# Patient Record
Sex: Male | Born: 1990 | Race: White | Hispanic: No | Marital: Married | Smoking: Never smoker
Health system: Southern US, Community
[De-identification: ages and names within clinical notes are randomized; demographics above are authoritative.]

## PROBLEM LIST (undated history)

## (undated) DIAGNOSIS — R51 Headache: Secondary | ICD-10-CM

## (undated) DIAGNOSIS — G8929 Other chronic pain: Secondary | ICD-10-CM

## (undated) DIAGNOSIS — R519 Headache, unspecified: Secondary | ICD-10-CM

## (undated) DIAGNOSIS — K226 Gastro-esophageal laceration-hemorrhage syndrome: Secondary | ICD-10-CM

## (undated) DIAGNOSIS — K920 Hematemesis: Secondary | ICD-10-CM

## (undated) DIAGNOSIS — M25569 Pain in unspecified knee: Secondary | ICD-10-CM

## (undated) HISTORY — PX: APPENDECTOMY: SHX54

## (undated) HISTORY — PX: OTHER SURGICAL HISTORY: SHX169

---
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
## 2010-03-28 ENCOUNTER — Emergency Department (HOSPITAL_COMMUNITY)
Admission: EM | Admit: 2010-03-28 | Discharge: 2010-03-28 | Payer: Self-pay | Source: Home / Self Care | Admitting: Emergency Medicine

## 2010-04-16 ENCOUNTER — Encounter: Payer: Self-pay | Admitting: Family Medicine
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
## 2010-04-26 NOTE — Letter (Signed)
Summary: History form  History form   Imported By: Jacklynn Ganong 07/04/2009 13:11:42  _____________________________________________________________________  External Attachment:    Type:   Image     Comment:   External Document

## 2010-07-09 LAB — RAPID STREP SCREEN (MED CTR MEBANE ONLY): Streptococcus, Group A Screen (Direct): NEGATIVE

## 2010-07-09 LAB — STREP A DNA PROBE

## 2013-02-09 ENCOUNTER — Encounter (HOSPITAL_COMMUNITY): Payer: Self-pay | Admitting: Emergency Medicine

## 2013-02-09 ENCOUNTER — Emergency Department (HOSPITAL_COMMUNITY)
Admission: EM | Admit: 2013-02-09 | Discharge: 2013-02-09 | Discharge status: Against Medical Advice | Payer: Self-pay | Attending: Emergency Medicine | Admitting: Emergency Medicine

## 2013-02-09 DIAGNOSIS — IMO0002 Reserved for concepts with insufficient information to code with codable children: Secondary | ICD-10-CM | POA: Insufficient documentation

## 2013-02-09 DIAGNOSIS — Y9389 Activity, other specified: Secondary | ICD-10-CM | POA: Insufficient documentation

## 2013-02-09 DIAGNOSIS — Y9289 Other specified places as the place of occurrence of the external cause: Secondary | ICD-10-CM | POA: Insufficient documentation

## 2013-02-09 DIAGNOSIS — S8990XA Unspecified injury of unspecified lower leg, initial encounter: Secondary | ICD-10-CM | POA: Insufficient documentation

## 2013-02-09 DIAGNOSIS — S79919A Unspecified injury of unspecified hip, initial encounter: Secondary | ICD-10-CM | POA: Insufficient documentation

## 2013-02-09 NOTE — ED Notes (Signed)
Pt was called but no answer for the third time.
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
## 2013-02-09 NOTE — ED Notes (Signed)
Pt called twice but no answer

## 2013-02-10 ENCOUNTER — Emergency Department (HOSPITAL_COMMUNITY): Payer: Self-pay

## 2013-02-10 ENCOUNTER — Emergency Department (HOSPITAL_COMMUNITY)
Admission: EM | Admit: 2013-02-10 | Discharge: 2013-02-10 | Disposition: A | Discharge status: Home / Self Care | Payer: Self-pay | Attending: Emergency Medicine | Admitting: Emergency Medicine

## 2013-02-10 ENCOUNTER — Encounter (HOSPITAL_COMMUNITY): Payer: Self-pay | Admitting: Emergency Medicine

## 2013-02-10 DIAGNOSIS — Y9389 Activity, other specified: Secondary | ICD-10-CM | POA: Insufficient documentation

## 2013-02-10 DIAGNOSIS — S7010XA Contusion of unspecified thigh, initial encounter: Secondary | ICD-10-CM | POA: Insufficient documentation

## 2013-02-10 DIAGNOSIS — S7002XA Contusion of left hip, initial encounter: Secondary | ICD-10-CM

## 2013-02-10 DIAGNOSIS — Y92410 Unspecified street and highway as the place of occurrence of the external cause: Secondary | ICD-10-CM | POA: Insufficient documentation

## 2013-02-10 MED ORDER — IBUPROFEN 600 MG PO TABS: 600.0000 mg | ORAL_TABLET | Freq: Four times a day (QID) | ORAL | Status: DC | PRN

## 2013-02-10 MED ORDER — IBUPROFEN 800 MG PO TABS
800.0000 mg | ORAL_TABLET | Freq: Once | ORAL | Status: AC
  Administered 2013-02-10: 800 mg via ORAL
  Filled 2013-02-10: qty 1
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
## 2013-02-12 NOTE — ED Provider Notes (Signed)
Medical screening examination/treatment/procedure(s) were performed by non-physician practitioner and as supervising physician I was immediately available for consultation/collaboration.  Flint Melter, MD 02/12/13 [phone]

## 2013-03-25 DIAGNOSIS — K226 Gastro-esophageal laceration-hemorrhage syndrome: Secondary | ICD-10-CM

## 2013-03-25 HISTORY — DX: Gastro-esophageal laceration-hemorrhage syndrome: K22.6

## 2013-03-26 ENCOUNTER — Encounter (HOSPITAL_COMMUNITY): Payer: Self-pay | Admitting: Emergency Medicine

## 2013-03-26 ENCOUNTER — Emergency Department (HOSPITAL_COMMUNITY)
Admission: EM | Admit: 2013-03-26 | Discharge: 2013-03-26 | Disposition: A | Discharge status: Home / Self Care | Payer: Self-pay | Attending: Emergency Medicine | Admitting: Emergency Medicine

## 2013-03-26 DIAGNOSIS — R197 Diarrhea, unspecified: Secondary | ICD-10-CM | POA: Insufficient documentation

## 2013-03-26 DIAGNOSIS — Z8739 Personal history of other diseases of the musculoskeletal system and connective tissue: Secondary | ICD-10-CM | POA: Insufficient documentation

## 2013-03-26 DIAGNOSIS — R112 Nausea with vomiting, unspecified: Secondary | ICD-10-CM | POA: Insufficient documentation

## 2013-03-26 DIAGNOSIS — K922 Gastrointestinal hemorrhage, unspecified: Secondary | ICD-10-CM | POA: Insufficient documentation

## 2013-03-26 LAB — CBC WITH DIFFERENTIAL/PLATELET
BASOS ABS: 0 10*3/uL (ref 0.0–0.1)
BASOS PCT: 0 % (ref 0–1)
EOS PCT: 1 % (ref 0–5)
Eosinophils Absolute: 0.1 10*3/uL (ref 0.0–0.7)
HCT: 40.6 % (ref 39.0–52.0)
Hemoglobin: 13.7 g/dL (ref 13.0–17.0)
LYMPHS PCT: 32 % (ref 12–46)
Lymphs Abs: 2.3 10*3/uL (ref 0.7–4.0)
MCH: 31.6 pg (ref 26.0–34.0)
MCHC: 33.7 g/dL (ref 30.0–36.0)
MCV: 93.5 fL (ref 78.0–100.0)
MONOS PCT: 7 % (ref 3–12)
Monocytes Absolute: 0.5 10*3/uL (ref 0.1–1.0)
NEUTROS ABS: 4.4 10*3/uL (ref 1.7–7.7)
NEUTROS PCT: 60 % (ref 43–77)
Platelets: 338 10*3/uL (ref 150–400)
RBC: 4.34 MIL/uL (ref 4.22–5.81)
RDW: 12.2 % (ref 11.5–15.5)
WBC: 7.2 10*3/uL (ref 4.0–10.5)

## 2013-03-26 LAB — COMPREHENSIVE METABOLIC PANEL
ALK PHOS: 43 U/L (ref 39–117)
ALT: 16 U/L (ref 0–53)
AST: 17 U/L (ref 0–37)
Albumin: 3.9 g/dL (ref 3.5–5.2)
BUN: 6 mg/dL (ref 6–23)
CHLORIDE: 104 meq/L (ref 96–112)
CO2: 26 mEq/L (ref 19–32)
CREATININE: 0.7 mg/dL (ref 0.50–1.35)
Calcium: 9.2 mg/dL (ref 8.4–10.5)
GFR calc non Af Amer: >90 mL/min (ref >90)
Glucose, Bld: 91 mg/dL (ref 70–99)
POTASSIUM: 3.5 meq/L — AB (ref 3.7–5.3)
SODIUM: 139 meq/L (ref 137–147)
Total Bilirubin: 0.3 mg/dL (ref 0.3–1.2)
Total Protein: 7.3 g/dL (ref 6.0–8.3)

## 2013-03-26 MED ORDER — CIPROFLOXACIN HCL 500 MG PO TABS: 500.0000 mg | ORAL_TABLET | Freq: Two times a day (BID) | ORAL | Status: DC

## 2013-03-26 MED ORDER — TRAMADOL HCL 50 MG PO TABS: 50.0000 mg | ORAL_TABLET | Freq: Four times a day (QID) | ORAL | Status: DC | PRN

## 2013-03-26 MED ORDER — OXYCODONE-ACETAMINOPHEN 5-325 MG PO TABS: 1.0000 | ORAL_TABLET | Freq: Four times a day (QID) | ORAL | Status: DC | PRN

## 2013-03-26 NOTE — ED Provider Notes (Signed)
CSN: [id number]     Arrival date & time 03/26/13  1251 History   First MD Initiated Contact with Patient 03/26/13 1424     Chief Complaint  Patient presents with  . Abdominal Pain   (Consider location/radiation/quality/duration/timing/severity/associated sxs/prior Treatment) Patient is a 23 y.o. male presenting with abdominal pain. The history is provided by the patient.  Abdominal Pain Associated symptoms: diarrhea and nausea   Associated symptoms: no chest pain, no shortness of breath and no vomiting    patient has had abdominal pain for the last week or 2. States it is in his lower abdomen. He also set some nausea with some diarrhea. Also blood coming out. The pain is constant. It is relieved by [facility] powder, but not ibuprofen. He's had a mildly decreased appetite. No fevers. No other bleeding. The pain is worse at night.  Past Medical History  Diagnosis Date  . Knee pain    History reviewed. No pertinent past surgical history. No family history on file. History  Substance Use Topics  . Smoking status: Never Smoker   . Smokeless tobacco: Not on file  . Alcohol Use: No     Comment: denies use tonight 02/09/13, denies use in 6 months    Review of Systems  Constitutional: Negative for activity change and appetite change.  Eyes: Negative for pain.  Respiratory: Negative for chest tightness and shortness of breath.   Cardiovascular: Negative for chest pain and leg swelling.  Gastrointestinal: Positive for nausea, abdominal pain, diarrhea, blood in stool and rectal pain. Negative for vomiting.  Genitourinary: Negative for flank pain.  Musculoskeletal: Negative for back pain and neck stiffness.  Skin: Negative for rash.  Neurological: Negative for weakness, numbness and headaches.  Psychiatric/Behavioral: Negative for behavioral problems.    Allergies  Review of patient's allergies indicates no known allergies.  Home Medications   Current Outpatient Rx  Name  Route  Sig   Dispense  Refill  . acetaminophen (TYLENOL) 500 MG tablet   Oral   Take 1,000 mg by mouth every 6 (six) hours as needed for moderate pain.         . Aspirin-Salicylamide-Caffeine (BC HEADACHE POWDER PO)   Oral   Take 1 packet by mouth daily as needed (pain).         . ciprofloxacin (CIPRO) 500 MG tablet   Oral   Take 1 tablet (500 mg total) by mouth 2 (two) times daily.   6 tablet   0   . traMADol (ULTRAM) 50 MG tablet   Oral   Take 1 tablet (50 mg total) by mouth every 6 (six) hours as needed.   15 tablet   0    BP 122/78  Pulse 85  Temp(Src) 98 F (36.7 C) (Oral)  Resp 18  Ht 5\' 2"  (1.575 m)  Wt 135 lb (61.236 kg)  BMI 24.69 kg/m2  SpO2 100% Physical Exam  Nursing note and vitals reviewed. Constitutional: He is oriented to person, place, and time. He appears well-developed and well-nourished.  HENT:  Head: Normocephalic and atraumatic.  Eyes: EOM are normal. Pupils are equal, round, and reactive to light.  Neck: Normal range of motion. Neck supple.  Cardiovascular: Normal rate, regular rhythm and normal heart sounds.   No murmur heard. Pulmonary/Chest: Effort normal and breath sounds normal.  Abdominal: Soft. Bowel sounds are normal. He exhibits no distension and no mass. There is no tenderness. There is no rebound and no guarding.  Musculoskeletal: Normal range of motion. He  exhibits no edema.  Neurological: He is alert and oriented to person, place, and time. No cranial nerve deficit.  Skin: Skin is warm and dry.  Psychiatric: He has a normal mood and affect.    ED Course  Procedures (including critical care time) Labs Review Labs Reviewed  COMPREHENSIVE METABOLIC PANEL - Abnormal; Notable for the following:    Potassium 3.5 (*)    All other components within normal limits  CBC WITH DIFFERENTIAL   Imaging Review No results found.  EKG Interpretation   None       MDM   1. Nausea vomiting and diarrhea   2. GI bleed    Patient with nausea  vomiting diarrhea. Has had some blood in the stool also. Also rectal pain or abdominal pain. Lab work reassuring. Patient does not have insurance and hopes to minimize expenses. Benign exam. We'll treat with antibiotics and have her followup with gastroenterology.   Juliet RudeNathan R. Rubin PayorPickering, MD 03/26/13 [phone]

## 2013-03-26 NOTE — ED Notes (Signed)
Pt c/o abd pain and has noticed bright red blood in his stool intermittently over the past 2 weeks.  Also reports intermittent n/v.

## 2013-03-26 NOTE — ED Notes (Signed)
Cancel departure condition and care handoff.  Wrong pt

## 2013-03-26 NOTE — Discharge Instructions (Signed)
Bloody Diarrhea  Bloody diarrhea can be caused by many different conditions. Most of the time bloody diarrhea is the result of food poisoning or minor infections. Bloody diarrhea usually improves over 2 to 3 days of rest and fluid replacement. Other conditions that can cause bloody diarrhea include:   Internal bleeding.   Infection.   Diseases of the bowel and colon.  Internal bleeding from an ulcer or bowel disease can be severe and requires hospital care or even surgery.  DIAGNOSIS   To find out what is wrong your caregiver may check your:   Stool.   Blood.   Results from a test that looks inside the body (endoscopy).  TREATMENT    Get plenty of rest.   Drink enough water and fluids to keep your urine clear or pale yellow.   Do not smoke.   Solid foods and dairy products should be avoided until your illness improves.   As you improve, slowly return to a regular diet with easily-digested foods first. Examples are:   Bananas.   Rice.   Toast.   Crackers.  You should only need these for about 2 days before adding more normal foods to your diet.   Avoid spicy or fatty foods as well as caffeine and alcohol for several days.   Medicine to control cramping and diarrhea can relieve symptoms but may prolong some cases of bloody diarrhea. Antibiotics can speed recovery from diarrhea due to some bacterial infections. Call your caregiver if diarrhea does not get better in 3 days.  SEEK MEDICAL CARE IF:    You do not improve after 3 days.   Your diarrhea improves but your stool appears black.  SEEK IMMEDIATE MEDICAL CARE IF:    You become extremely weak or faint.   You become very sweaty.   You have increased pain or bleeding.   You develop repeated vomiting.   You vomit and you see blood or the vomit looks black in color.   You have a fever.  Document Released: 03/11/2005 Document Revised: 06/03/2011 Document Reviewed: 02/10/2009  [employer] Patient Information 2014 [employer].

## 2013-06-25 ENCOUNTER — Observation Stay (HOSPITAL_COMMUNITY)
Admission: EM | Admit: 2013-06-25 | Discharge: 2013-06-26 | Disposition: A | Discharge status: Home / Self Care | Payer: Self-pay | Attending: Family Medicine | Admitting: Family Medicine

## 2013-06-25 ENCOUNTER — Encounter (HOSPITAL_COMMUNITY)
Admission: EM | Disposition: A | Discharge status: Home / Self Care | Payer: Self-pay | Source: Home / Self Care | Attending: Emergency Medicine

## 2013-06-25 ENCOUNTER — Encounter (HOSPITAL_COMMUNITY): Payer: Self-pay | Admitting: Emergency Medicine

## 2013-06-25 DIAGNOSIS — T39395A Adverse effect of other nonsteroidal anti-inflammatory drugs [NSAID], initial encounter: Secondary | ICD-10-CM

## 2013-06-25 DIAGNOSIS — G8929 Other chronic pain: Secondary | ICD-10-CM | POA: Insufficient documentation

## 2013-06-25 DIAGNOSIS — T398X5A Adverse effect of other nonopioid analgesics and antipyretics, not elsewhere classified, initial encounter: Secondary | ICD-10-CM

## 2013-06-25 DIAGNOSIS — K922 Gastrointestinal hemorrhage, unspecified: Secondary | ICD-10-CM | POA: Diagnosis present

## 2013-06-25 DIAGNOSIS — T3995XA Adverse effect of unspecified nonopioid analgesic, antipyretic and antirheumatic, initial encounter: Secondary | ICD-10-CM | POA: Insufficient documentation

## 2013-06-25 DIAGNOSIS — R51 Headache: Secondary | ICD-10-CM | POA: Insufficient documentation

## 2013-06-25 DIAGNOSIS — M25569 Pain in unspecified knee: Secondary | ICD-10-CM | POA: Insufficient documentation

## 2013-06-25 DIAGNOSIS — K226 Gastro-esophageal laceration-hemorrhage syndrome: Principal | ICD-10-CM | POA: Insufficient documentation

## 2013-06-25 HISTORY — DX: Hematemesis: K92.0

## 2013-06-25 HISTORY — PX: ESOPHAGOGASTRODUODENOSCOPY: SHX5428

## 2013-06-25 LAB — POC OCCULT BLOOD, ED: Fecal Occult Bld: POSITIVE — AB

## 2013-06-25 LAB — COMPREHENSIVE METABOLIC PANEL
ALBUMIN: 4.2 g/dL (ref 3.5–5.2)
ALT: 23 U/L (ref 0–53)
AST: 21 U/L (ref 0–37)
Alkaline Phosphatase: 43 U/L (ref 39–117)
BUN: 22 mg/dL (ref 6–23)
CHLORIDE: 102 meq/L (ref 96–112)
CO2: 26 meq/L (ref 19–32)
Calcium: 9 mg/dL (ref 8.4–10.5)
Creatinine, Ser: 0.69 mg/dL (ref 0.50–1.35)
GLUCOSE: 107 mg/dL — AB (ref 70–99)
Potassium: 4.1 mEq/L (ref 3.7–5.3)
SODIUM: 140 meq/L (ref 137–147)
TOTAL PROTEIN: 7.5 g/dL (ref 6.0–8.3)
Total Bilirubin: 1 mg/dL (ref 0.3–1.2)

## 2013-06-25 LAB — CBC WITH DIFFERENTIAL/PLATELET
BASOS ABS: 0 10*3/uL (ref 0.0–0.1)
Basophils Relative: 0 % (ref 0–1)
Eosinophils Absolute: 0.1 10*3/uL (ref 0.0–0.7)
Eosinophils Relative: 1 % (ref 0–5)
HCT: 39.4 % (ref 39.0–52.0)
Hemoglobin: 13 g/dL (ref 13.0–17.0)
LYMPHS ABS: 1.9 10*3/uL (ref 0.7–4.0)
Lymphocytes Relative: 17 % (ref 12–46)
MCH: 31.3 pg (ref 26.0–34.0)
MCHC: 33 g/dL (ref 30.0–36.0)
MCV: 94.9 fL (ref 78.0–100.0)
MONOS PCT: 6 % (ref 3–12)
Monocytes Absolute: 0.7 10*3/uL (ref 0.1–1.0)
NEUTROS PCT: 76 % (ref 43–77)
Neutro Abs: 8.5 10*3/uL — ABNORMAL HIGH (ref 1.7–7.7)
PLATELETS: 332 10*3/uL (ref 150–400)
RBC: 4.15 MIL/uL — ABNORMAL LOW (ref 4.22–5.81)
RDW: 12.9 % (ref 11.5–15.5)
WBC: 11.2 10*3/uL — AB (ref 4.0–10.5)

## 2013-06-25 LAB — SAMPLE TO BLOOD BANK

## 2013-06-25 SURGERY — EGD (ESOPHAGOGASTRODUODENOSCOPY)
Anesthesia: Moderate Sedation

## 2013-06-25 MED ORDER — ONDANSETRON HCL 4 MG/2ML IJ SOLN
4.0000 mg | Freq: Once | INTRAMUSCULAR | Status: AC
  Administered 2013-06-25: 4 mg via INTRAMUSCULAR
  Filled 2013-06-25: qty 2

## 2013-06-25 MED ORDER — LIDOCAINE VISCOUS 2 % MT SOLN
OROMUCOSAL | Status: DC | PRN
  Administered 2013-06-25: 1 via OROMUCOSAL

## 2013-06-25 MED ORDER — LIDOCAINE VISCOUS 2 % MT SOLN
OROMUCOSAL | Status: AC
  Filled 2013-06-25: qty 15

## 2013-06-25 MED ORDER — SODIUM CHLORIDE 0.9 % IJ SOLN
PREFILLED_SYRINGE | INTRAMUSCULAR | Status: DC | PRN
  Administered 2013-06-25 (×4)

## 2013-06-25 MED ORDER — METOCLOPRAMIDE HCL 5 MG/ML IJ SOLN
20.0000 mg | Freq: Once | INTRAVENOUS | Status: AC
  Administered 2013-06-25: 20 mg via INTRAVENOUS
  Filled 2013-06-25: qty 4

## 2013-06-25 MED ORDER — MEPERIDINE HCL 100 MG/ML IJ SOLN
INTRAMUSCULAR | Status: AC
  Filled 2013-06-25: qty 2

## 2013-06-25 MED ORDER — EPINEPHRINE HCL 0.1 MG/ML IJ SOSY
PREFILLED_SYRINGE | INTRAMUSCULAR | Status: AC
  Filled 2013-06-25: qty 10

## 2013-06-25 MED ORDER — ONDANSETRON HCL 4 MG/2ML IJ SOLN: 4.0000 mg | Freq: Four times a day (QID) | INTRAMUSCULAR | Status: DC | PRN

## 2013-06-25 MED ORDER — SODIUM CHLORIDE 0.9 % IJ SOLN
3.0000 mL | Freq: Two times a day (BID) | INTRAMUSCULAR | Status: DC
  Administered 2013-06-25 – 2013-06-26 (×2): 3 mL via INTRAVENOUS

## 2013-06-25 MED ORDER — ONDANSETRON HCL 4 MG PO TABS: 4.0000 mg | ORAL_TABLET | Freq: Four times a day (QID) | ORAL | Status: DC | PRN

## 2013-06-25 MED ORDER — FAMOTIDINE IN NACL 20-0.9 MG/50ML-% IV SOLN
20.0000 mg | Freq: Once | INTRAVENOUS | Status: AC
  Administered 2013-06-25: 20 mg via INTRAVENOUS
  Filled 2013-06-25: qty 50

## 2013-06-25 MED ORDER — STERILE WATER FOR IRRIGATION IR SOLN
Status: DC | PRN
  Administered 2013-06-25: 11:00:00

## 2013-06-25 MED ORDER — SODIUM CHLORIDE 0.9 % IV SOLN: 250.0000 mL | INTRAVENOUS | Status: DC | PRN

## 2013-06-25 MED ORDER — ACETAMINOPHEN 325 MG PO TABS
650.0000 mg | ORAL_TABLET | Freq: Four times a day (QID) | ORAL | Status: DC | PRN
  Administered 2013-06-25 – 2013-06-26 (×2): 650 mg via ORAL
  Filled 2013-06-25 (×3): qty 2

## 2013-06-25 MED ORDER — MEPERIDINE HCL 100 MG/ML IJ SOLN
INTRAMUSCULAR | Status: DC | PRN
  Administered 2013-06-25 (×5): 25 mg via INTRAVENOUS

## 2013-06-25 MED ORDER — MIDAZOLAM HCL 5 MG/5ML IJ SOLN
INTRAMUSCULAR | Status: DC | PRN
  Administered 2013-06-25: 2 mg via INTRAVENOUS
  Administered 2013-06-25: 1 mg via INTRAVENOUS
  Administered 2013-06-25 (×2): 2 mg via INTRAVENOUS

## 2013-06-25 MED ORDER — SODIUM CHLORIDE 0.9 % IV SOLN: INTRAVENOUS | Status: DC

## 2013-06-25 MED ORDER — MORPHINE SULFATE 4 MG/ML IJ SOLN
4.0000 mg | Freq: Once | INTRAMUSCULAR | Status: AC
  Administered 2013-06-25: 4 mg via INTRAVENOUS
  Filled 2013-06-25: qty 1

## 2013-06-25 MED ORDER — ALUM & MAG HYDROXIDE-SIMETH 200-200-20 MG/5ML PO SUSP: 30.0000 mL | Freq: Four times a day (QID) | ORAL | Status: DC | PRN

## 2013-06-25 MED ORDER — SODIUM CHLORIDE 0.9 % IV BOLUS (SEPSIS)
1000.0000 mL | Freq: Once | INTRAVENOUS | Status: AC
  Administered 2013-06-25: 1000 mL via INTRAVENOUS

## 2013-06-25 MED ORDER — MIDAZOLAM HCL 5 MG/5ML IJ SOLN
INTRAMUSCULAR | Status: AC
  Filled 2013-06-25: qty 10

## 2013-06-25 MED ORDER — PROMETHAZINE HCL 25 MG/ML IJ SOLN
12.5000 mg | INTRAMUSCULAR | Status: AC
  Administered 2013-06-25: 12.5 mg via INTRAVENOUS
  Filled 2013-06-25: qty 1

## 2013-06-25 MED ORDER — PANTOPRAZOLE SODIUM 40 MG PO TBEC
40.0000 mg | DELAYED_RELEASE_TABLET | Freq: Every day | ORAL | Status: DC
Start: 2013-06-26 — End: 2013-06-26
  Administered 2013-06-26: 40 mg via ORAL
  Filled 2013-06-25: qty 1

## 2013-06-25 MED ORDER — PROMETHAZINE HCL 25 MG/ML IJ SOLN
INTRAMUSCULAR | Status: DC | PRN
  Administered 2013-06-25: 12.5 mg via INTRAVENOUS

## 2013-06-25 MED ORDER — PROMETHAZINE HCL 25 MG/ML IJ SOLN
INTRAMUSCULAR | Status: AC
  Filled 2013-06-25: qty 1

## 2013-06-25 MED ORDER — PANTOPRAZOLE SODIUM 40 MG PO TBEC
40.0000 mg | DELAYED_RELEASE_TABLET | Freq: Once | ORAL | Status: AC
  Administered 2013-06-25: 40 mg via ORAL
  Filled 2013-06-25: qty 1

## 2013-06-25 MED ORDER — SODIUM CHLORIDE 0.9 % IJ SOLN: 3.0000 mL | INTRAMUSCULAR | Status: DC | PRN

## 2013-06-25 NOTE — ED Notes (Signed)
Pt went to bathroom, had vomiting episode. In to check and toilet full of bright red blood. PA aware

## 2013-06-25 NOTE — Care Management Note (Signed)
ED/CM noted patient did not have health insurance and/or PCP listed in the computer.  [facility] was given the [facility] handout with information on the clinics, food pantries, and the handout for new health insurance sign-up.  Patient expressed appreciation for information received. Pt was also given the financial counselor, Kathie RhodesBetty Ratliff's name and number for assistance with the hospital bill, and a discount drug card

## 2013-06-25 NOTE — Op Note (Signed)
[facility] [address] [place], [id number]   ENDOSCOPY PROCEDURE REPORT  PATIENT: James Boyle, James R.  MR#: [id number] BIRTHDATE: Aug 29, 1990 , 23  yrs. old GENDER: Male  ENDOSCOPIST: Jonette EvaSandi Lindie Roberson, MD REFERRED BY:  PROCEDURE DATE: 06/25/2013 PROCEDURE:   EGD w/ control of bleeding and EGD w/ directed submucosal injection: epi 8 cc  INDICATIONS:Hematemesis. MEDICATIONS: Promethazine (Phenergan) 25mg  IV, Demerol 100 mg IV, and Versed 6 mg IV TOPICAL ANESTHETIC:   Viscous Xylocaine  DESCRIPTION OF PROCEDURE:     Physical exam was performed.  Informed consent was obtained from the patient after explaining the benefits, risks, and alternatives to the procedure.  The patient was connected to the monitor and placed in the left lateral position.  Continuous oxygen was provided by nasal cannula and IV medicine administered through an indwelling cannula.  After administration of sedation, the patients esophagus was intubated and the     endoscope was advanced under direct visualization to the second portion of the duodenum.  The scope was removed slowly by carefully examining the color, texture, anatomy, and integrity of the mucosa on the way out.  The patient was recovered in endoscopy and discharged home in satisfactory condition.    BRIGHT RED BLOOD IN DISTAL ESOPHAGUS.  TWO TEARS NOTED AT THE GE JUNCTION WERE ACTIVELY BLEEDING. 8cc epinephrine injected in the submucosal space.   MODERATE AMOUNT OF CLOT/BRIGHT RED BLOOD IN FUNDUS.  SMALL AMOUNT THROUGHOUT ENTIRE STOMACH AND SMALL BOWEL. SLIGHTLY LIMITED ENDOSCOPY DUE TO RETAINED BLOOD IN STOMACH AND SMALL BOWEL.  NO ULCERS APPRECIATED IN STOMACH OR SMALL BOWEL. 4(2: BS, 2: COOK) OUT 5 CLIPS PLACED Successfully AT THE GE JUNCTION.    Hemostasis achieved. 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 AVOID ASA/NSAIDS FOR 2 WEEKS THEN MAY TAKE WITH  A DAILY PPI. FULL LIQUID DIET.  WILL ADVANCE IN AM APR 4. ANTICIPATE D/C APR 4 IF HB STABLE AND PT DOES NOT HAVE HEMATEMESIS.  DISCUSSED WITH DR.  Irene LimboGOODRICH AND PT'S GRANDPARENTS.  REPEAT EXAM: ______________________________ Rosalie DoctoreSignedJonette Eva:  James Febo, MD 06/25/2013 2:31 PM

## 2013-06-25 NOTE — H&P (View-Only) (Signed)
Referring Provider: No ref. provider found Primary Care Physician:  No PCP Per Patient Primary Gastroenterologist:  Jonette EvaSandi Rayson Rando  Reason for Consultation:  VOMITING UP BLOOD   HPI:  PT STARTED VOMITING BLOOD THIS AM AT 5 AM. LAST TME 30 MINS AGO. USING BC POWDERS FOR JOINT PAIN. PT DENIES FEVER, CHILLS, BRBPR, nausea, melena, abd pain, problems swallowing, OR heartburn or indigestion.  Past Medical History  Diagnosis Date  . Knee pain   . Hematemesis     History reviewed. No pertinent past surgical history.  Prior to Admission medications   Medication Sig Start Date End Date Taking? Authorizing Provider  Aspirin-Salicylamide-Caffeine (BC HEADACHE POWDER PO) Take 1 packet by mouth 3 (three) times daily. Patient takes 2 packets every morning when he gets up and takes 1 at work around lunchtime and then takes another one when he gets home if he feels like he needs it.   Yes Historical Provider, MD    Current Facility-Administered Medications  Medication Dose Route Frequency Provider Last Rate Last Dose  . metoCLOPramide (REGLAN) 20 mg in dextrose 5 % 50 mL IVPB  20 mg Intravenous Once Burgess AmorJulie Idol, PA-C       Current Outpatient Prescriptions  Medication Sig Dispense Refill  . Aspirin-Salicylamide-Caffeine (BC HEADACHE POWDER PO) Take 1 packet by mouth 3 (three) times daily. Patient takes 2 packets every morning when he gets up and takes 1 at work around lunchtime and then takes another one when he gets home if he feels like he needs it.        Allergies as of 06/25/2013  . (No Known Allergies)      History   Social History  . Marital Status: Married    Spouse Name: N/A    Number of Children: ONE  . Years of Education: N/A   Occupational History  . WORKS IN CONSTRUCTION   Social History Main Topics  . Smoking status: Never Smoker   . Smokeless tobacco: Not on file  . Alcohol Use: No     Comment: denies use tonight 02/09/13, denies use in 6 months  . Drug Use: No  .  Sexual Activity: Not on file     Review of Systems: PER HPI OTHERWISE ALL SYSTEMS ARE NEGATIVE.   Vitals: Blood pressure 122/75, pulse 110, temperature 97.6 F (36.4 C), temperature source Oral, resp. rate 12, height 5\' 2"  (1.575 m), weight 145 lb (65.772 kg), SpO2 98.00%.  Physical Exam: General:   Alert,  Well-developed, well-nourished, pleasant and cooperative in NAD Head:  Normocephalic and atraumatic. Eyes:  Sclera clear, no icterus.   Conjunctiva pink. Mouth:  No deformity or lesions Neck:  Supple; no masses  Lungs:  Clear throughout to auscultation.   No wheezes. No acute distress. Heart:  Regular rate and rhythm; no murmurs. Abdomen:  Soft, nontender and nondistended. No masses noted. Normal bowel sounds, without guarding, and without rebound.   Msk:  Symmetrical without gross deformities. Normal posture. Extremities:  Without edema. Neurologic:  Alert and  oriented x4;  grossly normal neurologically. Cervical Nodes:  No significant cervical adenopathy. Psych:  Alert and cooperative. Normal mood and affect.   Lab Results:  Recent Labs  06/25/13 0846  WBC 11.2*  HGB 13.0  HCT 39.4  PLT 332   BMET  Recent Labs  06/25/13 0846  NA 140  K 4.1  CL 102  CO2 26  GLUCOSE 107*  BUN 22  CREATININE 0.69  CALCIUM 9.0   LFT  Recent Labs  06/25/13 0846  PROT 7.5  ALBUMIN 4.2  AST 21  ALT 23  ALKPHOS 43  BILITOT 1.0     Studies/Results: NONE  Impression: ADMITTED TO ED WITH HEMATEMESIS MOST LIKELY DUE TO PUD, LESS LIKELY MALLORY WEISS TEAR, OR GASTRIC TUMOR. S/P REGLAN AND PO PROTONIX  Plan: 1. EGD ASAP 2. WILL NEED PROTONIX INFUSION IF ULCERS NEED INTERVENTION. 3. PT SHOULD AVOID AS/NSAIDS FOR 3 MOS   LOS: 0 days   Lile Mccurley  06/25/2013, 9:32 AM

## 2013-06-25 NOTE — H&P (Signed)
History and Physical  James LovingsJames R Burgoon ZOX:[id number] DOB: 03/04/1991 DOA: 06/25/2013  Referring physician: Burgess AmorJulie Idol, PA-C PCP: No PCP Per Patient   Chief Complaint: Bleeding with vomiting  HPI:  23 year old man with history of daily BC powder use for chronic headaches and chronic knee pain woke up this morning with hematemesis. Initial evaluation was unrevealing, patient was seen by gastroenterology and referred for admission.  Patient reports chronic headaches and chronic knee pain for which he takes BC powders. Today when he woke up he vomited bright red blood several times and had mild abdominal pain. Felt fine yesterday with no bleeding yesterday.  In the emergency department and be afebrile with stable vital signs. Complete metabolic panel is unremarkable. CBC grossly unremarkable. Fecal occult blood positive. Underwent bedside EGD in the emergency department which revealed Mallory-Weiss tear with active bleeding, treated with clips and epinephrine. I was asked to see the patient for admission after EGD was performed.  Currently the patient feels well except for chronic headache he is hungry.  Review of Systems:  Negative for fever, visual changes, sore throat, rash, new muscle aches, chest pain, SOB, dysuria.  Past Medical History  Diagnosis Date  . Knee pain   . Hematemesis     Past Surgical History  Procedure Laterality Date  . None      Social History:  reports that he has never smoked. He does not have any smokeless tobacco history on file. He reports that he does not drink alcohol or use illicit drugs.  No Known Allergies  History reviewed. No pertinent family history. No particular medical problems in family except mother had back surgery   Prior to Admission medications   Medication Sig Start Date End Date Taking? Authorizing Provider  Aspirin-Salicylamide-Caffeine (BC HEADACHE POWDER PO) Take 1 packet by mouth 3 (three) times daily. Patient takes 2 packets every  morning when he gets up and takes 1 at work around lunchtime and then takes another one when he gets home if he feels like he needs it.   Yes Historical Provider, MD   Physical Exam: Filed Vitals:   06/25/13 1205 06/25/13 1210 06/25/13 1302 06/25/13 1415  BP: 137/77   112/74  Pulse: 120 109  161  Temp:    97.4 F (36.3 C)  TempSrc:    Oral  Resp: 23 24 20 22   Height:      Weight:      SpO2: 92% 96% 97% 97%   General:  Appears calm and comfortable Eyes: Grossly normal pupils, irises, lids ENT: grossly normal hearing, lips & tongue Neck: no LAD, masses or thyromegaly Cardiovascular: RRR, no m/r/g. No LE edema. Respiratory: CTA bilaterally, no w/r/r. Normal respiratory effort. Abdomen: soft, ntnd Skin: no rash or induration seen. Multiple tattoos noted. Musculoskeletal: grossly normal tone BUE/BLE Psychiatric: grossly normal mood and affect, speech fluent and appropriate Neurologic: grossly non-focal.  Wt Readings from Last 3 Encounters:  06/25/13 65.772 kg (145 lb)  06/25/13 65.772 kg (145 lb)  03/26/13 61.236 kg (135 lb)    Labs on Admission:  Basic Metabolic Panel:  Recent Labs Lab 06/25/13 0846  NA 140  K 4.1  CL 102  CO2 26  GLUCOSE 107*  BUN 22  CREATININE 0.69  CALCIUM 9.0    Liver Function Tests:  Recent Labs Lab 06/25/13 0846  AST 21  ALT 23  ALKPHOS 43  BILITOT 1.0  PROT 7.5  ALBUMIN 4.2    CBC:  Recent Labs Lab 06/25/13 0846  WBC 11.2*  NEUTROABS 8.5*  HGB 13.0  HCT 39.4  MCV 94.9  PLT 332    Active Problems:   GI bleed due to NSAIDs   Assessment/Plan 1. Upper GI bleed secondary to Mallory-Weiss tear. 2. Chronic NSAID use for chronic headaches and chronic knee pain.   Observation overnight. Daily PPI. Avoid aspirin, NSAIDs for 2 weeks.  Likely home 4/4 if hemoglobin stable and no further bleeding.  Discussed with Dr. Darrick Penna  Code Status: full code  DVT prophylaxis:SCDs Family Communication: none present Disposition  Plan/Anticipated LOS: obs, 1-2 days  Time spent: 45 minutes  Brendia Sacks, MD  [facility] Pager [phone] 06/25/2013, 4:40 PM

## 2013-06-25 NOTE — ED Notes (Signed)
Dr Darrick PennaFields in with pt.

## 2013-06-25 NOTE — ED Notes (Signed)
Pt started with emesis at 5 am, reports 10 episodes of emesis, pt states the blood is dark red. Pt states he has HX of same.

## 2013-06-25 NOTE — ED Provider Notes (Signed)
Medical screening examination/treatment/procedure(s) were performed by non-physician practitioner and as supervising physician I was immediately available for consultation/collaboration.   EKG Interpretation None        Rhylei Mcquaig Oseias, MD 06/25/13 1601 

## 2013-06-25 NOTE — Interval H&P Note (Signed)
History and Physical Interval Note:  06/25/2013 11:23 AM  James Boyle  has presented today for surgery, with the diagnosis of hematesis  The various methods of treatment have been discussed with the patient and family. After consideration of risks, benefits and other options for treatment, the patient has consented to  Procedure(s): ESOPHAGOGASTRODUODENOSCOPY (EGD) (N/A) as a surgical intervention .  The patient's history has been reviewed, patient examined, no change in status, stable for surgery.  I have reviewed the patient's chart and labs.  Questions were answered to the patient's satisfaction.     Eaton CorporationSandi Carlosdaniel Grob

## 2013-06-25 NOTE — ED Notes (Signed)
pt c/o headache.

## 2013-06-25 NOTE — Consult Note (Signed)
Referring Provider: No ref. provider found Primary Care Physician:  No PCP Per Patient Primary Gastroenterologist:  [name]  Reason for Consultation:  VOMITING UP BLOOD   HPI:  PT STARTED VOMITING BLOOD THIS AM AT 5 AM. LAST TME 30 MINS AGO. USING BC POWDERS FOR JOINT PAIN. PT DENIES FEVER, CHILLS, BRBPR, nausea, melena, abd pain, problems swallowing, OR heartburn or indigestion.  Past Medical History  Diagnosis Date  . Knee pain   . Hematemesis     History reviewed. No pertinent past surgical history.  Prior to Admission medications   Medication Sig Start Date End Date Taking? Authorizing Provider  Aspirin-Salicylamide-Caffeine (BC HEADACHE POWDER PO) Take 1 packet by mouth 3 (three) times daily. Patient takes 2 packets every morning when he gets up and takes 1 at work around lunchtime and then takes another one when he gets home if he feels like he needs it.   Yes Historical Provider, MD    Current Facility-Administered Medications  Medication Dose Route Frequency Provider Last Rate Last Dose  . metoCLOPramide (REGLAN) 20 mg in dextrose 5 % 50 mL IVPB  20 mg Intravenous Once Burgess AmorJulie Idol, PA-C       Current Outpatient Prescriptions  Medication Sig Dispense Refill  . Aspirin-Salicylamide-Caffeine (BC HEADACHE POWDER PO) Take 1 packet by mouth 3 (three) times daily. Patient takes 2 packets every morning when he gets up and takes 1 at work around lunchtime and then takes another one when he gets home if he feels like he needs it.        Allergies as of 06/25/2013  . (No Known Allergies)      History   Social History  . Marital Status: Married    Spouse Name: N/A    Number of Children: ONE  . Years of Education: N/A   Occupational History  . WORKS IN CONSTRUCTION   Social History Main Topics  . Smoking status: Never Smoker   . Smokeless tobacco: Not on file  . Alcohol Use: No     Comment: denies use tonight 02/09/13, denies use in 6 months  . Drug Use: No  .  Sexual Activity: Not on file     Review of Systems: PER HPI OTHERWISE ALL SYSTEMS ARE NEGATIVE.   Vitals: Blood pressure 122/75, pulse 110, temperature 97.6 F (36.4 C), temperature source Oral, resp. rate 12, height 5\' 2"  (1.575 m), weight 145 lb (65.772 kg), SpO2 98.00%.  Physical Exam: General:   Alert,  Well-developed, well-nourished, pleasant and cooperative in NAD Head:  Normocephalic and atraumatic. Eyes:  Sclera clear, no icterus.   Conjunctiva pink. Mouth:  No deformity or lesions Neck:  Supple; no masses  Lungs:  Clear throughout to auscultation.   No wheezes. No acute distress. Heart:  Regular rate and rhythm; no murmurs. Abdomen:  Soft, nontender and nondistended. No masses noted. Normal bowel sounds, without guarding, and without rebound.   Msk:  Symmetrical without gross deformities. Normal posture. Extremities:  Without edema. Neurologic:  Alert and  oriented x4;  grossly normal neurologically. Cervical Nodes:  No significant cervical adenopathy. Psych:  Alert and cooperative. Normal mood and affect.   Lab Results:  Recent Labs  06/25/13 0846  WBC 11.2*  HGB 13.0  HCT 39.4  PLT 332   BMET  Recent Labs  06/25/13 0846  NA 140  K 4.1  CL 102  CO2 26  GLUCOSE 107*  BUN 22  CREATININE 0.69  CALCIUM 9.0   LFT  Recent Labs  06/25/13 0846  PROT 7.5  ALBUMIN 4.2  AST 21  ALT 23  ALKPHOS 43  BILITOT 1.0     Studies/Results: NONE  Impression: ADMITTED TO ED WITH HEMATEMESIS MOST LIKELY DUE TO PUD, LESS LIKELY MALLORY WEISS TEAR, OR GASTRIC TUMOR. S/P REGLAN AND PO PROTONIX  Plan: 1. EGD ASAP 2. WILL NEED PROTONIX INFUSION IF ULCERS NEED INTERVENTION. 3. PT SHOULD AVOID AS/NSAIDS FOR 3 MOS   LOS: 0 days   James Boyle  06/25/2013, 9:32 AM

## 2013-06-25 NOTE — ED Provider Notes (Signed)
CSN: [id number]     Arrival date & time 06/25/13  [id number] History   First MD Initiated Contact with Patient 06/25/13 0805     Chief Complaint  Patient presents with  . Hematemesis     (Consider location/radiation/quality/duration/timing/severity/associated sxs/prior Treatment) HPI Comments: James Boyle is a 23 y.o. Male presenting with complaint of upper abdominal pain and hematemesis along with one black but formed stool since 5 am this am.  He describes about 10 episodes of vomiting,  With both bright red and darker appearing blood, starting with the first emesis. He does state he has frequent upper abdominal and midsternal burning with meals but denies any treatment for this such as antacids or PPIs.   He endorses about 4 doses of BC powders daily for both headache and chronic knee pain.  He denies etoh use and substance abuse, stating he used to use marijuana and narcotic pills, but not since being on probation.  He works as a Corporate investment bankerconstruction worker.     The history is provided by the patient.    Past Medical History  Diagnosis Date  . Knee pain   . Hematemesis    History reviewed. No pertinent past surgical history. History reviewed. No pertinent family history. History  Substance Use Topics  . Smoking status: Never Smoker   . Smokeless tobacco: Not on file  . Alcohol Use: No     Comment: denies use tonight 02/09/13, denies use in 6 months    Review of Systems  Constitutional: Negative for fever and chills.  HENT: Negative for congestion and sore throat.   Eyes: Negative.   Respiratory: Negative for chest tightness and shortness of breath.   Cardiovascular: Negative for chest pain.  Gastrointestinal: Positive for nausea, vomiting, abdominal pain and blood in stool. Negative for diarrhea, abdominal distention and rectal pain.  Genitourinary: Negative.   Musculoskeletal: Negative for arthralgias, joint swelling and neck pain.  Skin: Negative.  Negative for rash and wound.   Neurological: Negative for dizziness, weakness, light-headedness, numbness and headaches.  Psychiatric/Behavioral: Negative.       Allergies  Review of patient's allergies indicates no known allergies.  Home Medications   Current Outpatient Rx  Name  Route  Sig  Dispense  Refill  . Aspirin-Salicylamide-Caffeine (BC HEADACHE POWDER PO)   Oral   Take 1 packet by mouth 3 (three) times daily. Patient takes 2 packets every morning when he gets up and takes 1 at work around lunchtime and then takes another one when he gets home if he feels like he needs it.          BP 122/75  Pulse 110  Temp(Src) 97.6 F (36.4 C) (Oral)  Resp 12  Ht 5\' 2"  (1.575 m)  Wt 145 lb (65.772 kg)  BMI 26.51 kg/m2  SpO2 98% Physical Exam  Nursing note and vitals reviewed. Constitutional: He appears well-developed and well-nourished.  HENT:  Head: Normocephalic and atraumatic.  Eyes: Conjunctivae are normal.  Neck: Normal range of motion.  Cardiovascular: Normal rate, regular rhythm, normal heart sounds and intact distal pulses.   Pulmonary/Chest: Effort normal and breath sounds normal. He has no wheezes.  Abdominal: Soft. Bowel sounds are normal. He exhibits no distension. There is no hepatosplenomegaly. There is tenderness in the epigastric area. There is no rebound and no guarding.  Musculoskeletal: Normal range of motion.  Neurological: He is alert.  Skin: Skin is warm and dry.  Psychiatric: He has a normal mood and affect.  ED Course  Procedures (including critical care time) Labs Review Labs Reviewed  CBC WITH DIFFERENTIAL  COMPREHENSIVE METABOLIC PANEL  OCCULT BLOOD X 1 CARD TO LAB, STOOL  SAMPLE TO BLOOD BANK   Imaging Review No results found.   EKG Interpretation None      MDM   Final diagnoses:  None    Hemoccult positive per bedside hemoccult.  Pt with hematemesis, sx and risk factors suggesting bleeding peptic ulcer.    Patients labs and/or radiological studies  were viewed and considered during the medical decision making and disposition process.   Pt discussed with Dr. Darrick Penna who will admit patient and plan upper endoscopy today.     Burgess Amor, PA-C 06/25/13 [phone]

## 2013-06-26 LAB — CBC
HCT: 34.2 % — ABNORMAL LOW (ref 39.0–52.0)
Hemoglobin: 11.4 g/dL — ABNORMAL LOW (ref 13.0–17.0)
MCH: 31.9 pg (ref 26.0–34.0)
MCHC: 33.3 g/dL (ref 30.0–36.0)
MCV: 95.8 fL (ref 78.0–100.0)
PLATELETS: 304 10*3/uL (ref 150–400)
RBC: 3.57 MIL/uL — ABNORMAL LOW (ref 4.22–5.81)
RDW: 12.8 % (ref 11.5–15.5)
WBC: 9.5 10*3/uL (ref 4.0–10.5)

## 2013-06-26 MED ORDER — OMEPRAZOLE 20 MG PO CPDR: 20.0000 mg | DELAYED_RELEASE_CAPSULE | Freq: Every day | ORAL | Status: DC

## 2013-06-26 MED ORDER — OXYCODONE HCL 5 MG PO TABS
5.0000 mg | ORAL_TABLET | Freq: Once | ORAL | Status: AC
  Administered 2013-06-26: 5 mg via ORAL
  Filled 2013-06-26: qty 1

## 2013-06-26 MED ORDER — ACETAMINOPHEN 325 MG PO TABS: 650.0000 mg | ORAL_TABLET | Freq: Four times a day (QID) | ORAL | Status: DC | PRN

## 2013-06-26 MED ORDER — MORPHINE SULFATE 2 MG/ML IJ SOLN
1.0000 mg | Freq: Once | INTRAMUSCULAR | Status: AC
  Administered 2013-06-26: 1 mg via INTRAVENOUS
  Filled 2013-06-26: qty 1

## 2013-06-26 NOTE — Discharge Summary (Signed)
  Physician Discharge Summary  Lily LovingsJames R Klemens ZOX:[id number] DOB: 01/14/1991 DOA: 06/25/2013  PCP: No PCP Per Patient  Admit date: 06/25/2013 Discharge date: 06/26/2013  Discharge Diagnoses:  1. Upper GI bleed secondary to Mallory-Weiss tear complicated by chronic NSAID use.  Discharge Condition: Improved Disposition: Home  Diet recommendation: Regular  Filed Weights   06/25/13 0807  Weight: 65.772 kg (145 lb)    History of present illness:  23 year old man with history of daily BC powder use for chronic headaches and chronic knee pain woke up this morning with hematemesis. Initial evaluation was unrevealing, patient was seen by gastroenterology and referred for admission.  Hospital Course:  Patient was admitted for further evaluation of hematemesis, underwent bedside EGD which revealed Mallory-Weiss tear, underwent treatment for this, condition stabilized without recurrent bleeding, now stable for discharge. Individual issues as below.  1. Upper GI bleed secondary to Mallory-Weiss tear complicated by chronic NSAID use. 2. Chronic NSAID use for chronic headaches and chronic knee pain. Continue daily PPI. Avoid aspirin, NSAIDs for 2 weeks.  Consultants:  Gastroenterology Procedures:  Bedside EGD in the emergency department which revealed Mallory-Weiss tear with active bleeding, treated with clips and epinephrine  Discharge Instructions  Discharge Orders   Future Orders Complete By Expires   Activity as tolerated - No restrictions  As directed    Diet general  As directed    Discharge instructions  As directed    Comments:     Avoid all NSAIDs (including BC/Goody powders, Aleve, Motrin, ibuprofen, naprosyn, Advil) for least 2 weeks. Too much of these medications will cause bleeding. Take omeprazole daily for your stomach. Seek immediate medical attention for recurrent bleeding, pain or worsening of condition. Recommend establishing with primary care provider of your choice.         Medication List    STOP taking these medications       BC HEADACHE POWDER PO      TAKE these medications       acetaminophen 325 MG tablet  Commonly known as:  TYLENOL  Take 2 tablets (650 mg total) by mouth every 6 (six) hours as needed for mild pain or headache.     omeprazole 20 MG capsule  Commonly known as:  PRILOSEC  Take 1 capsule (20 mg total) by mouth daily.       No Known Allergies  The results of significant diagnostics from this hospitalization (including imaging, microbiology, ancillary and laboratory) are listed below for reference.     Labs: Basic Metabolic Panel:  Recent Labs Lab 06/25/13 0846  NA 140  K 4.1  CL 102  CO2 26  GLUCOSE 107*  BUN 22  CREATININE 0.69  CALCIUM 9.0   Liver Function Tests:  Recent Labs Lab 06/25/13 0846  AST 21  ALT 23  ALKPHOS 43  BILITOT 1.0  PROT 7.5  ALBUMIN 4.2   CBC:  Recent Labs Lab 06/25/13 0846 06/26/13 0526  WBC 11.2* 9.5  NEUTROABS 8.5*  --   HGB 13.0 11.4*  HCT 39.4 34.2*  MCV 94.9 95.8  PLT 332 304    Active Problems:   GI bleed due to NSAIDs   UGIB (upper gastrointestinal bleed)   Time coordinating discharge: 20 minutes  Signed:  Brendia Sacksaniel Goodrich, MD [facility] 06/26/2013, 1:31 PM

## 2013-06-26 NOTE — Progress Notes (Signed)
Patient requesting that IV be removed because it was hurting and stating pain was a 10 out of 10 and tylenol was not going to help. Paged MD. MD ordered to restart IV just in case patient is not discharged today and also ordered a one time dose of 1mg  of morphine. Patient refused for IV to be restarted at this time and wants to leave current IV in place. Current IV flushes well with no signs of infiltration. Patient received 1mg  of morphine at 0636. Will continue to monitor patient.

## 2013-06-26 NOTE — Progress Notes (Signed)
  PROGRESS NOTE  James Boyle ZOX:[id number] DOB: 07/19/1990 DOA: 06/25/2013 PCP: No PCP Per Patient  Summary: 23 year old man with history of daily BC powder use for chronic headaches and chronic knee pain woke up this morning with hematemesis. Initial evaluation was unrevealing, patient was seen by gastroenterology and referred for admission.  Assessment/Plan: 1. Upper GI bleed secondary to Mallory-Weiss tear complicated by chronic NSAID use. 2. Chronic NSAID use for chronic headaches and chronic knee pain.   Continue daily PPI. Avoid aspirin, NSAIDs for 2 weeks.  Home today. Discussed today with Dr. Darrick PennaFields.  Brendia Sacksaniel Laderius Valbuena, MD  [facility]  Pager [phone] If 7PM-7AM, please contact night-coverage at [email or web address], password [email or web address] 06/26/2013, 1:24 PM  LOS: 1 day   Consultants:  Gastroenterology  Procedures:  Bedside EGD in the emergency department which revealed Mallory-Weiss tear with active bleeding, treated with clips and epinephrine  HPI/Subjective: Feels much better. No vomiting or blood. Wants to go home.  Objective: Filed Vitals:   06/25/13 1302 06/25/13 1415 06/25/13 2058 06/26/13 0426  BP:  112/74 96/60 123/73  Pulse:  161 78 88  Temp:  97.4 F (36.3 C) 98.1 F (36.7 C) 98 F (36.7 C)  TempSrc:  Oral Oral Oral  Resp: 20 22 20 20   Height:      Weight:      SpO2: 97% 97% 99% 98%    Intake/Output Summary (Last 24 hours) at 06/26/13 1324 Last data filed at 06/26/13 0900  Gross per 24 hour  Intake   1183 ml  Output      0 ml  Net   1183 ml     Filed Weights   06/25/13 0807  Weight: 65.772 kg (145 lb)    Exam:   Afebrile, vital signs stable. No hypoxia.  Gen. Appears calm and comfortable. Speech fluent and clear.  Cardiovascular. Regular rate and rhythm. No murmur, rub or gallop.  Respiratory clear to auscultation bilaterally. No wheezes, rales or rhonchi. Normal respiratory effort.  Abdomen soft.  Data Reviewed:  CBC noted,  hemoglobin 11.4.  Scheduled Meds: . pantoprazole  40 mg Oral QAC breakfast  . sodium chloride  3 mL Intravenous Q12H   Continuous Infusions:   Active Problems:   GI bleed due to NSAIDs   UGIB (upper gastrointestinal bleed)

## 2013-06-26 NOTE — Progress Notes (Signed)
Pt discharged to home with wife.  Pt ambulatory to front door.  Given one prescription and discharge instructions.  Pt inquired about pain medication he stated Dr Darrick PennaFields was going to order for him.  No record of this on chart.  Instructed pt to follow up with Dr Darrick PennaFields office on Monday.  Pt had card to schedule follow up appointment.

## 2013-06-26 NOTE — Progress Notes (Signed)
Subjective: Since I last evaluated the patient NO BRBPR OR MELENA. TOLERATING POs. C/O HEADACHE. NO CHEST PAIN OR SOB.  Objective: Vital signs in last 24 hours: Temp:  [97.4 F (36.3 C)-98.1 F (36.7 C)] 98 F (36.7 C) (04/04 0426) Pulse Rate:  [73-165] 88 (04/04 0426) Resp:  [13-24] 20 (04/04 0426) BP: (96-141)/(58-89) 123/73 mmHg (04/04 0426) SpO2:  [83 %-100 %] 98 % (04/04 0426) Last BM Date: 06/25/13  Intake/Output from previous day: 04/03 0701 - 04/04 0700 In: 483 [P.O.:480; I.V.:3] Out: -  Intake/Output this shift: Total I/O In: 700 [P.O.:700] Out: -   General appearance: alert, cooperative and no distress Resp: clear to auscultation bilaterally Cardio: regular rate and rhythm GI: soft, non-tender; bowel sounds normal; no masses,  no organomegaly  Lab Results:  Recent Labs  06/25/13 0846 06/26/13 0526  WBC 11.2* 9.5  HGB 13.0 11.4*  HCT 39.4 34.2*  PLT 332 304   BMET  Recent Labs  06/25/13 0846  NA 140  K 4.1  CL 102  CO2 26  GLUCOSE 107*  BUN 22  CREATININE 0.69  CALCIUM 9.0   LFT  Recent Labs  06/25/13 0846  PROT 7.5  ALBUMIN 4.2  AST 21  ALT 23  ALKPHOS 43  BILITOT 1.0    Medications: I have reviewed the patient's current medications.  Assessment/Plan: ADMITTED W/ HEMATEMESIS DUE TO MALLORY WEISS TEAR.  PLAN: 1. NO ASA/NSAIDS FOR 2 WEEKS. OXYCODONE NOW FOR HA. TYL #3 OR VICODIN #15 ON DISCHARGE. 2. NO MRI FOR 30 DAYS 3.  OK TO D/C HOME TODAY    LOS: 1 day   Sandi Fields 06/26/2013, 10:48 AM

## 2013-06-26 NOTE — Progress Notes (Signed)
Pt consumed 75% of breakfast without complications.  No nausea or vomiting noted.  Pt stated he is having loose stools but no longer sees any blood in them.  Pt educated concerning BC powders their use.  Verbalized understanding but states that is the only thing that works for him.

## 2013-06-28 ENCOUNTER — Telehealth: Payer: Self-pay

## 2013-06-28 MED ORDER — HYDROCODONE-ACETAMINOPHEN 5-325 MG PO TABS: 1.0000 | ORAL_TABLET | Freq: Four times a day (QID) | ORAL | Status: DC | PRN

## 2013-06-28 NOTE — Telephone Encounter (Signed)
PLEASE CALL PT. CALL IN VICODIN 5/325 #15 1 PO Q6H ON PAIN, [id number].

## 2013-06-28 NOTE — Telephone Encounter (Signed)
RX written per [facility] recommendations.

## 2013-06-28 NOTE — Telephone Encounter (Signed)
[facility] called me back I am not able to call into pain medication. Pt will have to come by to pick up Rx.  Verlon AuLeslie  Can you please print out at Rx for this medication.  Thanks

## 2013-06-28 NOTE — Telephone Encounter (Signed)
Tried to call with no answer  

## 2013-06-28 NOTE — Telephone Encounter (Signed)
[facility] [place] Pt is [place]

## 2013-06-28 NOTE — Addendum Note (Signed)
Addended by: Tiffany KocherLEWIS, Lisle Skillman S on: 06/28/2013 11:03 AM   Modules accepted: Orders

## 2013-06-28 NOTE — Telephone Encounter (Signed)
Pt is calling this morning to follow up on pain medication that he was going to get from [facility] when discharged from the hospital. Please advise

## 2013-06-29 ENCOUNTER — Encounter (HOSPITAL_COMMUNITY): Payer: Self-pay | Admitting: Gastroenterology

## 2013-07-01 ENCOUNTER — Encounter (HOSPITAL_COMMUNITY): Payer: Self-pay | Admitting: Emergency Medicine

## 2013-07-01 ENCOUNTER — Emergency Department (HOSPITAL_COMMUNITY)
Admission: EM | Admit: 2013-07-01 | Discharge: 2013-07-01 | Disposition: A | Discharge status: Home / Self Care | Payer: Self-pay | Attending: Emergency Medicine | Admitting: Emergency Medicine

## 2013-07-01 ENCOUNTER — Emergency Department (HOSPITAL_COMMUNITY): Payer: Self-pay

## 2013-07-01 DIAGNOSIS — R072 Precordial pain: Secondary | ICD-10-CM | POA: Insufficient documentation

## 2013-07-01 DIAGNOSIS — Z79899 Other long term (current) drug therapy: Secondary | ICD-10-CM | POA: Insufficient documentation

## 2013-07-01 DIAGNOSIS — R079 Chest pain, unspecified: Secondary | ICD-10-CM

## 2013-07-01 DIAGNOSIS — Z8719 Personal history of other diseases of the digestive system: Secondary | ICD-10-CM | POA: Insufficient documentation

## 2013-07-01 MED ORDER — ACETAMINOPHEN 325 MG PO TABS
650.0000 mg | ORAL_TABLET | Freq: Four times a day (QID) | ORAL | Status: DC | PRN
  Administered 2013-07-01: 650 mg via ORAL
  Filled 2013-07-01: qty 2

## 2013-07-01 NOTE — ED Provider Notes (Signed)
CSN: [id number]     Arrival date & time 07/01/13  2044 History  This chart was scribed for Joya Gaskinsonald W Cleopha Indelicato, MD by Bennett Scrapehristina Taylor, ED Scribe. This patient was seen in room [facility]06/[facility] and the patient's care was started at 9:26 PM.   Chief Complaint  Patient presents with  . Chest Pain     Patient is a 23 y.o. male presenting with chest pain. The history is provided by the patient. No language interpreter was used.  Chest Pain Pain location:  Substernal area Pain radiates to:  Does not radiate Pain radiates to the back: no   Duration:  3 days Timing:  Intermittent Chronicity:  New Context: eating   Worsened by:  Deep breathing Associated symptoms: vomiting (yesterday, none since )   Associated symptoms: no cough and no shortness of breath   Risk factors: no prior DVT/PE     HPI Comments: James Boyle is a 23 y.o. male who presents to the Emergency Department complaining of intermittent substernal CP that started 3 days ago. The pain is triggered by eating and is worsened with deep breathing. He states that he is able to tolerate food but just has pain shortly after consumption. Pt was recently admitted for a bleeding peptic ulcer. He was discharged 5 days ago with continued vomiting since discharge up until yesterday.  He denies hematemesis, hematochezia, SOB and cough as associated symptoms. He denies any prior h/o DVT or PE. He denies any chronic medical conditions and denies h/o cardiac problems.    Past Medical History  Diagnosis Date  . Knee pain   . Hematemesis    Past Surgical History  Procedure Laterality Date  . None    . Esophagogastroduodenoscopy N/A 06/25/2013    Procedure: ESOPHAGOGASTRODUODENOSCOPY (EGD);  Surgeon: West BaliSandi L Fields, MD;  Location: [facility];  Service: Endoscopy;  Laterality: N/A;   No family history on file. History  Substance Use Topics  . Smoking status: Never Smoker   . Smokeless tobacco: Not on file  . Alcohol Use: No     Comment:  denies use tonight 02/09/13, denies use in 6 months    Review of Systems  Respiratory: Negative for cough and shortness of breath.   Cardiovascular: Positive for chest pain.  Gastrointestinal: Positive for vomiting (yesterday, none since ). Negative for blood in stool.  All other systems reviewed and are negative.   Allergies  Review of patient's allergies indicates no known allergies.  Home Medications   Current Outpatient Rx  Name  Route  Sig  Dispense  Refill  . acetaminophen (TYLENOL) 325 MG tablet   Oral   Take 2 tablets (650 mg total) by mouth every 6 (six) hours as needed for mild pain or headache.         Marland Kitchen. HYDROcodone-acetaminophen (NORCO/VICODIN) 5-325 MG per tablet   Oral   Take 1 tablet by mouth every 6 (six) hours as needed for moderate pain.   15 tablet   0   . omeprazole (PRILOSEC) 20 MG capsule   Oral   Take 1 capsule (20 mg total) by mouth daily.   30 capsule   0    Triage Vitals: BP 129/77  Pulse 80  Temp(Src) 98.2 F (36.8 C) (Oral)  Resp 18  Ht 5\' 2"  (1.575 m)  Wt 140 lb (63.504 kg)  BMI 25.60 kg/m2  SpO2 97%  Physical Exam  Nursing note and vitals reviewed.  CONSTITUTIONAL: Well developed/well nourished HEAD: Normocephalic/atraumatic EYES: EOMI/PERRL ENMT:  Mucous membranes moist NECK: supple no meningeal signs SPINE:entire spine nontender CV: S1/S2 noted, no murmurs/rubs/gallops noted LUNGS: Lungs are clear to auscultation bilaterally, no apparent distress ABDOMEN: soft, nontender, no rebound or guarding GU:no cva tenderness NEURO: Pt is awake/alert, moves all extremitiesx4 EXTREMITIES: pulses normal, full ROM, no edema or calf tenderness  SKIN: warm, color normal PSYCH: no abnormalities of mood noted  ED Course  Procedures   DIAGNOSTIC STUDIES: Oxygen Saturation is 97% on RA, adequate by my interpretation.    COORDINATION OF CARE: 9:32 PM-Discussed treatment plan which includes CXR with pt at bedside and pt agreed to plan.   Pt with recent EGD for upper GI bleed.  He now reports CP, seems to be triggered by eating I have low suspicion for acute PE.  I doubt ACS at this time  10:30 PM Discussed case with dr sandi fields.  Pt had clips placed in esophagus during EGD and this is likely triggering pain with solid foods.  Advised to cut back to food c/w mashed potatoes She does not feel further workup is necessary  Imaging Review Dg Chest 2 View  07/01/2013   CLINICAL DATA:  Chest pain  EXAM: CHEST  2 VIEW  COMPARISON:  04/07/2010  FINDINGS: Asymmetric inflation the lungs, left more than right, which may be positional. There is no significant atelectasis. No consolidation, effusion, or air leak. Normal heart size and upper mediastinal contours. Hemo clips noted in the region of the proximal stomach.  IMPRESSION: No active cardiopulmonary disease.   Electronically Signed   By: Tiburcio Pea M.D.   On: 07/01/2013 22:17     EKG Interpretation   Date/Time:  Thursday July 01 2013 20:55:34 EDT Ventricular Rate:  78 PR Interval:  128 QRS Duration: 94 QT Interval:  368 QTC Calculation: 419 R Axis:   76 Text Interpretation:  Normal sinus rhythm with sinus arrhythmia Normal ECG  When compared with ECG of 20-Apr-2008 01:11, No significant change was  found Confirmed by Bebe Shaggy  MD, Dorinda Hill ([id number]) on 07/01/2013 9:02:33 PM      MDM   Final diagnoses:  Chest pain    Nursing notes including past medical history and social history reviewed and considered in documentation xrays reviewed and considered Previous records reviewed and considered  I personally performed the services described in this documentation, which was scribed in my presence. The recorded information has been reviewed and is accurate.         Joya Gaskins, MD 07/01/13 2231

## 2013-07-01 NOTE — ED Notes (Signed)
Patient states he had a procedure on Friday; states he began having chest pain Tuesday night that feels like he's being punched in the chest.

## 2013-07-01 NOTE — Discharge Instructions (Signed)
PLEASE EAT FOOD THAT HAS CONSISTENCY OF MASHED POTATOES FOR 7 DAYS.  HEAVY/HARD FOODS CAN GET STUCK IN YOUR ESOPHAGUS AND CAUSE PAIN  Your caregiver has diagnosed you as having chest pain that is not specific for one problem, but does not require admission.  Chest pain comes from many different causes.  SEEK IMMEDIATE MEDICAL ATTENTION IF: You have severe chest pain, especially if the pain is crushing or pressure-like and spreads to the arms, back, neck, or jaw, or if you have sweating, nausea (feeling sick to your stomach), or shortness of breath. THIS IS AN EMERGENCY. Don't wait to see if the pain will go away. Get medical help at once. Call 911 or 0 (operator). DO NOT drive yourself to the hospital.  Your chest pain gets worse and does not go away with rest.  You have an attack of chest pain lasting longer than usual, despite rest and treatment with the medications your caregiver has prescribed.  You wake from sleep with chest pain or shortness of breath.  You feel dizzy or faint.  You have chest pain not typical of your usual pain for which you originally saw your caregiver.

## 2013-07-02 ENCOUNTER — Other Ambulatory Visit: Payer: Self-pay

## 2013-07-02 NOTE — Telephone Encounter (Signed)
Agree 

## 2013-07-02 NOTE — Telephone Encounter (Signed)
I told patient that we would not be able to refill his pain medication. He would need to contact his PCP. I also made him a follow up appointment with [facility] on May 7.

## 2013-07-29 ENCOUNTER — Encounter (INDEPENDENT_AMBULATORY_CARE_PROVIDER_SITE_OTHER): Payer: Self-pay | Admitting: Gastroenterology

## 2013-07-29 ENCOUNTER — Telehealth: Payer: Self-pay | Admitting: Gastroenterology

## 2013-07-29 ENCOUNTER — Encounter: Payer: Self-pay | Admitting: Gastroenterology

## 2013-07-29 NOTE — Telephone Encounter (Signed)
REVIEWED.  

## 2013-07-29 NOTE — Progress Notes (Signed)
   Subjective:    Patient ID: James Boyle, male    DOB: 09/26/1990, 23 y.o.   MRN: [id number]  HPI   Past Medical History  Diagnosis Date  . Knee pain   . Hematemesis      Review of Systems     Objective:   Physical Exam        Assessment & Plan:

## 2013-07-29 NOTE — Telephone Encounter (Signed)
Mailed letter °

## 2013-07-29 NOTE — Telephone Encounter (Signed)
Pt was a no show

## 2014-02-19 ENCOUNTER — Encounter (HOSPITAL_COMMUNITY): Payer: Self-pay | Admitting: *Deleted

## 2014-02-19 ENCOUNTER — Emergency Department (HOSPITAL_COMMUNITY)
Admission: EM | Admit: 2014-02-19 | Discharge: 2014-02-19 | Disposition: A | Discharge status: Home / Self Care | Payer: Self-pay | Attending: Emergency Medicine | Admitting: Emergency Medicine

## 2014-02-19 DIAGNOSIS — H16133 Photokeratitis, bilateral: Secondary | ICD-10-CM | POA: Insufficient documentation

## 2014-02-19 DIAGNOSIS — Z79899 Other long term (current) drug therapy: Secondary | ICD-10-CM | POA: Insufficient documentation

## 2014-02-19 DIAGNOSIS — Z8739 Personal history of other diseases of the musculoskeletal system and connective tissue: Secondary | ICD-10-CM | POA: Insufficient documentation

## 2014-02-19 DIAGNOSIS — Z8719 Personal history of other diseases of the digestive system: Secondary | ICD-10-CM | POA: Insufficient documentation

## 2014-02-19 MED ORDER — OXYCODONE-ACETAMINOPHEN 5-325 MG PO TABS: 1.0000 | ORAL_TABLET | ORAL | Status: DC | PRN

## 2014-02-19 MED ORDER — OXYCODONE-ACETAMINOPHEN 5-325 MG PO TABS
1.0000 | ORAL_TABLET | Freq: Once | ORAL | Status: AC
  Administered 2014-02-19: 1 via ORAL
  Filled 2014-02-19: qty 1

## 2014-02-19 MED ORDER — SULFACETAMIDE SODIUM 10 % OP SOLN: 2.0000 [drp] | OPHTHALMIC | Status: DC

## 2014-02-19 MED ORDER — FLUORESCEIN SODIUM 1 MG OP STRP
1.0000 | ORAL_STRIP | Freq: Once | OPHTHALMIC | Status: AC
  Administered 2014-02-19: 1 via OPHTHALMIC
  Filled 2014-02-19: qty 1

## 2014-02-19 MED ORDER — TETRACAINE HCL 0.5 % OP SOLN
2.0000 [drp] | Freq: Once | OPHTHALMIC | Status: AC
  Administered 2014-02-19: 2 [drp] via OPHTHALMIC
  Filled 2014-02-19: qty 2

## 2014-02-19 NOTE — ED Notes (Signed)
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 Pt now c/o redness, swelling, and burning to both.

## 2014-02-19 NOTE — ED Notes (Signed)
Pt alert & oriented x4, stable gait. Patient given discharge instructions, paperwork & prescription(s). Patient  instructed to stop at the registration desk to finish any additional paperwork. Patient verbalized understanding. Pt left department w/ no further questions. 

## 2014-02-19 NOTE — ED Provider Notes (Signed)
CSN: [id number]     Arrival date & time 02/19/14  0142 History   First MD Initiated Contact with Patient 02/19/14 0225     Chief complaint: Eye pain  (Consider location/radiation/quality/duration/timing/severity/associated sxs/prior Treatment) The history is provided by the patient.   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 This evening, he developed pain and redness in both his eyes. He rates pain at 9/10. He has not noticed any blurring of his vision. He denies other injury.  Past Medical History  Diagnosis Date  . Knee pain   . Hematemesis    Past Surgical History  Procedure Laterality Date  . None    . Esophagogastroduodenoscopy N/A 06/25/2013    Procedure: ESOPHAGOGASTRODUODENOSCOPY (EGD);  Surgeon: West BaliSandi L Fields, MD;  Location: [facility];  Service: Endoscopy;  Laterality: N/A;   History reviewed. No pertinent family history. History  Substance Use Topics  . Smoking status: Never Smoker   . Smokeless tobacco: Not on file  . Alcohol Use: No     Comment: denies use tonight 02/09/13, denies use in 6 months    Review of Systems  All other systems reviewed and are negative.     Allergies  Review of patient's allergies indicates no known allergies.  Home Medications   Prior to Admission medications   Medication Sig Start Date End Date Taking? Authorizing Provider  acetaminophen (TYLENOL) 325 MG tablet Take 2 tablets (650 mg total) by mouth every 6 (six) hours as needed for mild pain or headache. 06/26/13   Standley Brookinganiel P Goodrich, MD  HYDROcodone-acetaminophen (NORCO/VICODIN) 5-325 MG per tablet Take 1 tablet by mouth every 6 (six) hours as needed for moderate pain. 06/28/13   Tiffany KocherLeslie S Moeller, PA-C  omeprazole (PRILOSEC) 20 MG capsule Take 1 capsule (20 mg total) by mouth daily. 06/26/13   Standley Brookinganiel P Goodrich, MD  promethazine (PHENERGAN) 25 MG tablet Take 25 mg by mouth every 6 (six) hours as needed for nausea or vomiting.    Historical  Provider, MD   BP 124/79 mmHg  Pulse 102  Temp(Src) 99 F (37.2 C)  Resp 20  Ht 5\' 2"  (1.575 m)  Wt 135 lb (61.236 kg)  BMI 24.69 kg/m2  SpO2 100% Physical Exam  Nursing note and vitals reviewed.  23 year old male, resting comfortably and in no acute distress. Vital signs are significant for borderline tachycardia. Oxygen saturation is 100%, which is normal. Head is normocephalic and atraumatic. PERRLA, EOMI. Oropharynx is clear. There is moderate erythema of the conjunctivae of both eyes. No foreign body is seen on the cornea and anterior chamber is clear. Neck is nontender and supple without adenopathy or JVD. Back is nontender and there is no CVA tenderness. Lungs are clear without rales, wheezes, or rhonchi. Chest is nontender. Heart has regular rate and rhythm without murmur. Abdomen is soft, flat, nontender without masses or hepatosplenomegaly and peristalsis is normoactive. Extremities have no cyanosis or edema, full range of motion is present. Skin is warm and dry without rash. Neurologic: Mental status is normal, cranial nerves are intact, there are no motor or sensory deficits.  ED Course  Procedures (including critical care time) Visual acuity is unremarkable. Eyes were stained with flow seen and examined with Wood's lamp demonstrating punctate areas of increased uptake consistent with UV keratitis.  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 He is given prescription for sulfacetamide  time, solution and oxycodone-acetaminophen for pain. Referred to ophthalmology if not improving over the next several days.    Dione Boozeavid Donivan Thammavong, MD 02/19/14 [phone]

## 2014-02-19 NOTE — Discharge Instructions (Signed)
Always keep your eyes properly shielded when welding - it only takes a few seconds to be injured from the welding arc.   Ultraviolet Keratitis Ultraviolet keratitis can occur when too much UV light enters the cornea. The cornea is the clear cover on the front part of your eye that helps focus light. It protects your eyes from dust and other foreign bodies and filters ultraviolet rays. This condition can be caused by exposure to snow (snow blindness) from the reflected or direct sunlight. It may also be caused by exposure to welding arcs or halogen lamps (flash burn) and prolonged exposure to direct sunlight. Brief exposure can result in severe problems several hours later. CAUSES  Causes of ultraviolet keratitis include:  Exposure to snow (snow blindness) from the reflected or direct sunlight.  Exposure to welding arcs or halogen lamps (flash burn).  Prolonged exposure to direct sunlight. SYMPTOMS  The symptoms of ultraviolet keratitis usually start 6 to 12 hours after the damage occurred. They may include the following:   Tearing.  Light sensitivity.  Gritty sensation in eyes.  Swelling of your eyelids.  Severe pain. In spite of the pain, this condition will usually improve within 24 hours even without treatment. HOME CARE INSTRUCTIONS   Apply cold packs on your eyes to ease the pain.  Only take over-the-counter or prescription medicines for pain, discomfort, or fever as directed by your caregiver.  Your caregiver may also prescribe an antibiotic ointment to help prevent infection and/or additional medications for pain relief.  Apply an eye patch to help relieve discomfort and assist in healing. If your caregiver patches your eyes, it is important to leave these patches on.  Follow the instructions given to you by your caregiver.  Do not rub your eyes.  If your caregiver has given you a follow-up appointment, it is very important to keep that appointment. Not keeping the  appointment could result in a severe eye infection or permanent loss of vision. If there is any problem keeping the appointment, you must call back to this facility for assistance. SEEK IMMEDIATE MEDICAL CARE IF:   Pain is severe and not relieved by medication.  Pain or problems with vision last more than 48 hours.  Exposure to light is unavoidable and you need extra protection for your eyes. MAKE SURE YOU:   Understand these instructions.  Will watch your condition.  Will get help right away if you are not doing well or get worse. Document Released: 03/11/2005 Document Revised: 07/26/2013 Document Reviewed: 10/16/2007 [facility] Patient Information 2015 [employer], [place]. This information is not intended to replace advice given to you by your health care provider. Make sure you discuss any questions you have with your health care provider.  Acetaminophen; Oxycodone tablets What is this medicine? ACETAMINOPHEN; OXYCODONE (a set a MEE noe fen; ox i KOE done) is a pain reliever. It is used to treat mild to moderate pain. This medicine may be used for other purposes; ask your health care provider or pharmacist if you have questions. COMMON BRAND NAME(S): Endocet, Magnacet, Narvox, Percocet, Perloxx, Primalev, Primlev, Roxicet, Xolox What should I tell my health care provider before I take this medicine? They need to know if you have any of these conditions: -brain tumor -Crohn's disease, inflammatory bowel disease, or ulcerative colitis -drug abuse or addiction -head injury -heart or circulation problems -if you often drink alcohol -kidney disease or problems going to the bathroom -liver disease -lung disease, asthma, or breathing problems -an unusual or allergic  reaction to acetaminophen, oxycodone, other opioid analgesics, other medicines, foods, dyes, or preservatives -pregnant or trying to get pregnant -breast-feeding How should I use this medicine? Take this medicine by mouth with  a full glass of water. Follow the directions on the prescription label. Take your medicine at regular intervals. Do not take your medicine more often than directed. Talk to your pediatrician regarding the use of this medicine in children. Special care may be needed. Patients over 94 years old may have a stronger reaction and need a smaller dose. Overdosage: If you think you have taken too much of this medicine contact a poison control center or emergency room at once. NOTE: This medicine is only for you. Do not share this medicine with others. What if I miss a dose? If you miss a dose, take it as soon as you can. If it is almost time for your next dose, take only that dose. Do not take double or extra doses. What may interact with this medicine? -alcohol -antihistamines -barbiturates like amobarbital, butalbital, butabarbital, methohexital, pentobarbital, phenobarbital, thiopental, and secobarbital -benztropine -drugs for bladder problems like solifenacin, trospium, oxybutynin, tolterodine, hyoscyamine, and methscopolamine -drugs for breathing problems like ipratropium and tiotropium -drugs for certain stomach or intestine problems like propantheline, homatropine methylbromide, glycopyrrolate, atropine, belladonna, and dicyclomine -general anesthetics like etomidate, ketamine, nitrous oxide, propofol, desflurane, enflurane, halothane, isoflurane, and sevoflurane -medicines for depression, anxiety, or psychotic disturbances -medicines for sleep -muscle relaxants -naltrexone -narcotic medicines (opiates) for pain -phenothiazines like perphenazine, thioridazine, chlorpromazine, mesoridazine, fluphenazine, prochlorperazine, promazine, and trifluoperazine -scopolamine -tramadol -trihexyphenidyl This list may not describe all possible interactions. Give your health care provider a list of all the medicines, herbs, non-prescription drugs, or dietary supplements you use. Also tell them if you smoke,  drink alcohol, or use illegal drugs. Some items may interact with your medicine. What should I watch for while using this medicine? Tell your doctor or health care professional if your pain does not go away, if it gets worse, or if you have new or a different type of pain. You may develop tolerance to the medicine. Tolerance means that you will need a higher dose of the medication for pain relief. Tolerance is normal and is expected if you take this medicine for a long time. Do not suddenly stop taking your medicine because you may develop a severe reaction. Your body becomes used to the medicine. This does NOT mean you are addicted. Addiction is a behavior related to getting and using a drug for a non-medical reason. If you have pain, you have a medical reason to take pain medicine. Your doctor will tell you how much medicine to take. If your doctor wants you to stop the medicine, the dose will be slowly lowered over time to avoid any side effects. You may get drowsy or dizzy. Do not drive, use machinery, or do anything that needs mental alertness until you know how this medicine affects you. Do not stand or sit up quickly, especially if you are an older patient. This reduces the risk of dizzy or fainting spells. Alcohol may interfere with the effect of this medicine. Avoid alcoholic drinks. There are different types of narcotic medicines (opiates) for pain. If you take more than one type at the same time, you may have more side effects. Give your health care provider a list of all medicines you use. Your doctor will tell you how much medicine to take. Do not take more medicine than directed. Call emergency for help if  you have problems breathing. The medicine will cause constipation. Try to have a bowel movement at least every 2 to 3 days. If you do not have a bowel movement for 3 days, call your doctor or health care professional. Do not take Tylenol (acetaminophen) or medicines that have acetaminophen  with this medicine. Too much acetaminophen can be very dangerous. Many nonprescription medicines contain acetaminophen. Always read the labels carefully to avoid taking more acetaminophen. What side effects may I notice from receiving this medicine? Side effects that you should report to your doctor or health care professional as soon as possible: -allergic reactions like skin rash, itching or hives, swelling of the face, lips, or tongue -breathing difficulties, wheezing -confusion -light headedness or fainting spells -severe stomach pain -unusually weak or tired -yellowing of the skin or the whites of the eyes Side effects that usually do not require medical attention (report to your doctor or health care professional if they continue or are bothersome): -dizziness -drowsiness -nausea -vomiting This list may not describe all possible side effects. Call your doctor for medical advice about side effects. You may report side effects to FDA at 1-800-FDA-1088. Where should I keep my medicine? Keep out of the reach of children. This medicine can be abused. Keep your medicine in a safe place to protect it from theft. Do not share this medicine with anyone. Selling or giving away this medicine is dangerous and against the law. Store at room temperature between 20 and 25 degrees C (68 and 77 degrees F). Keep container tightly closed. Protect from light. This medicine may cause accidental overdose and death if it is taken by other adults, children, or pets. Flush any unused medicine down the toilet to reduce the chance of harm. Do not use the medicine after the expiration date. NOTE: This sheet is a summary. It may not cover all possible information. If you have questions about this medicine, talk to your doctor, pharmacist, or health care provider.  2015, Elsevier/Gold Standard. (2012-11-02 13:17:35)  Sulfacetamide eye solution What is this medicine? SULFACETAMIDE (sul fa SEE ta mide) is a  sulfonamide antibiotic. It is used to treat eye infections. This medicine may be used for other purposes; ask your health care provider or pharmacist if you have questions. COMMON BRAND NAME(S): Bleph-10, Ocu-Sul, Sodium Sulamyd, Sulf-10 What should I tell my health care provider before I take this medicine? They need to know if you have any of these conditions: -eye injury or eye surgery -an unusual or allergic reaction to sulfacetamide, sulfa drugs, other medicines, foods, dyes, or preservatives -pregnant or trying to get pregnant -breast-feeding How should I use this medicine? This medicine is only for use in the eye. Do not take by mouth. Follow the directions on the prescription label. Wash hands before and after use. Tilt your head back slightly and pull your lower eyelid down with your index finger to form a pouch. Try not to touch the tip of the dropper to your eye, fingertips, or any other surface. Squeeze the prescribed number of drops into the pouch. Close the eye gently to spread the drops. Your vision may blur for a few minutes. Use your doses at regular intervals. Do not use your medicine more often than directed. Finish the full course prescribed by your doctor or health care professional even if you think your condition is better. Talk to your pediatrician regarding the use of this medicine in children. Special care may be needed. Overdosage: If you think you have  taken too much of this medicine contact a poison control center or emergency room at once. NOTE: This medicine is only for you. Do not share this medicine with others. What if I miss a dose? If you miss a dose, use it as soon as you can. If it is almost time for your next dose, use only that dose. Do not use double or extra doses. What may interact with this medicine? -eye products that contain silver This list may not describe all possible interactions. Give your health care provider a list of all the medicines, herbs,  non-prescription drugs, or dietary supplements you use. Also tell them if you smoke, drink alcohol, or use illegal drugs. Some items may interact with your medicine. What should I watch for while using this medicine? Tell your doctor or health care professional if your symptoms do not get better in 2 to 3 days. A full course of treatment is usually 7 to 10 days. If you get any sign of an allergic reaction, stop using your eye product and call your doctor or health care professional. Wear sunglasses if this medicine makes your eyes more sensitive to light. Keep out of the sun, or wear protective clothing outdoors and use a sunscreen. Do not use sun lamps or sun tanning beds or booths. What side effects may I notice from receiving this medicine? Side effects that you should report to your doctor or health care professional as soon as possible: -blurred vision that does not go away -burning, blistering, peeling, stinging, or itching of the eyes or eyelids, skin or mouth -eye redness, swelling, or pain Side effects that usually do not require medical attention (report to your doctor or health care professional if they continue or are bothersome): -blurred vision for a few moments after application This list may not describe all possible side effects. Call your doctor for medical advice about side effects. You may report side effects to FDA at 1-800-FDA-1088. Where should I keep my medicine? Keep out of the reach of children. Store between 2 and 30 degrees C (36 and 86 degrees F). Do not freeze. Throw away any unused eye products after the expiration date. NOTE: This sheet is a summary. It may not cover all possible information. If you have questions about this medicine, talk to your doctor, pharmacist, or health care provider.  2015, Elsevier/Gold Standard. (2007-11-13 13:04:42)

## 2014-02-25 MED FILL — Oxycodone w/ Acetaminophen Tab 5-325 MG: ORAL | Qty: 6 | Status: AC

## 2015-04-17 ENCOUNTER — Emergency Department (HOSPITAL_COMMUNITY)
Admission: EM | Admit: 2015-04-17 | Discharge: 2015-04-17 | Disposition: A | Discharge status: Home / Self Care | Payer: Medicaid Other | Attending: Emergency Medicine | Admitting: Emergency Medicine

## 2015-04-17 ENCOUNTER — Encounter (HOSPITAL_COMMUNITY): Payer: Self-pay | Admitting: *Deleted

## 2015-04-17 DIAGNOSIS — T39395A Adverse effect of other nonsteroidal anti-inflammatory drugs [NSAID], initial encounter: Secondary | ICD-10-CM | POA: Insufficient documentation

## 2015-04-17 DIAGNOSIS — K2960 Other gastritis without bleeding: Secondary | ICD-10-CM | POA: Diagnosis not present

## 2015-04-17 DIAGNOSIS — K920 Hematemesis: Secondary | ICD-10-CM | POA: Diagnosis present

## 2015-04-17 LAB — CBC WITH DIFFERENTIAL/PLATELET
Basophils Absolute: 0 10*3/uL (ref 0.0–0.1)
Basophils Relative: 0 %
Eosinophils Absolute: 0.1 10*3/uL (ref 0.0–0.7)
Eosinophils Relative: 1 %
HCT: 42 % (ref 39.0–52.0)
Hemoglobin: 14 g/dL (ref 13.0–17.0)
Lymphocytes Relative: 30 %
Lymphs Abs: 3.1 10*3/uL (ref 0.7–4.0)
MCH: 30.7 pg (ref 26.0–34.0)
MCHC: 33.3 g/dL (ref 30.0–36.0)
MCV: 92.1 fL (ref 78.0–100.0)
Monocytes Absolute: 0.8 10*3/uL (ref 0.1–1.0)
Monocytes Relative: 7 %
Neutro Abs: 6.3 10*3/uL (ref 1.7–7.7)
Neutrophils Relative %: 62 %
Platelets: 433 10*3/uL — ABNORMAL HIGH (ref 150–400)
RBC: 4.56 MIL/uL (ref 4.22–5.81)
RDW: 12.6 % (ref 11.5–15.5)
WBC: 10.3 10*3/uL (ref 4.0–10.5)

## 2015-04-17 LAB — COMPREHENSIVE METABOLIC PANEL
ALT: 22 U/L (ref 17–63)
AST: 20 U/L (ref 15–41)
Albumin: 3.9 g/dL (ref 3.5–5.0)
Alkaline Phosphatase: 48 U/L (ref 38–126)
Anion gap: 7 (ref 5–15)
BUN: 6 mg/dL (ref 6–20)
CO2: 28 mmol/L (ref 22–32)
Calcium: 8.9 mg/dL (ref 8.9–10.3)
Chloride: 107 mmol/L (ref 101–111)
Creatinine, Ser: 0.67 mg/dL (ref 0.61–1.24)
GFR calc Af Amer: >60 mL/min (ref >60)
GFR calc non Af Amer: >60 mL/min (ref >60)
Glucose, Bld: 90 mg/dL (ref 65–99)
Potassium: 3.6 mmol/L (ref 3.5–5.1)
Sodium: 142 mmol/L (ref 135–145)
Total Bilirubin: 0.5 mg/dL (ref 0.3–1.2)
Total Protein: 6.4 g/dL — ABNORMAL LOW (ref 6.5–8.1)

## 2015-04-17 LAB — LIPASE, BLOOD: Lipase: 31 U/L (ref 11–51)

## 2015-04-17 MED ORDER — FAMOTIDINE IN NACL 20-0.9 MG/50ML-% IV SOLN
20.0000 mg | Freq: Once | INTRAVENOUS | Status: AC
  Administered 2015-04-17: 20 mg via INTRAVENOUS
  Filled 2015-04-17: qty 50

## 2015-04-17 MED ORDER — GI COCKTAIL ~~LOC~~
30.0000 mL | Freq: Once | ORAL | Status: AC
  Administered 2015-04-17: 30 mL via ORAL
  Filled 2015-04-17: qty 30

## 2015-04-17 MED ORDER — PANTOPRAZOLE SODIUM 40 MG IV SOLR
40.0000 mg | Freq: Once | INTRAVENOUS | Status: AC
  Administered 2015-04-17: 40 mg via INTRAVENOUS
  Filled 2015-04-17: qty 40

## 2015-04-17 MED ORDER — PANTOPRAZOLE SODIUM 20 MG PO TBEC: 20.0000 mg | DELAYED_RELEASE_TABLET | Freq: Two times a day (BID) | ORAL | Status: DC

## 2015-04-17 NOTE — ED Notes (Signed)
Pt states he began vomiting blood 2 weeks ago and was initially seen at [facility] and states they told him there was no trace of blood at the time. Last occurred this morning (Pt has picture). States hx of same with having to have clips placed to esophagus by Dr. Darrick Penna.

## 2015-04-17 NOTE — Discharge Instructions (Signed)
You need to significantly limit the amount of NSAIDs you take (BC powder but also ibuprofen, aleve, advil, naprosyn, etc). This is irritating your stomach and you may have an ulcer. Take prescribed pain medication as needed instead. You also describe symptoms of GERD (acid reflux). Take protonix twice a day for two weeks and then daily in the evening.    Gastritis, Adult Gastritis is soreness and swelling (inflammation) of the lining of the stomach. Gastritis can develop as a sudden onset (acute) or long-term (chronic) condition. If gastritis is not treated, it can lead to stomach bleeding and ulcers. CAUSES  Gastritis occurs when the stomach lining is weak or damaged. Digestive juices from the stomach then inflame the weakened stomach lining. The stomach lining may be weak or damaged due to viral or bacterial infections. One common bacterial infection is the Helicobacter pylori infection. Gastritis can also result from excessive alcohol consumption, taking certain medicines, or having too much acid in the stomach.  SYMPTOMS  In some cases, there are no symptoms. When symptoms are present, they may include:  Pain or a burning sensation in the upper abdomen.  Nausea.  Vomiting.  An uncomfortable feeling of fullness after eating. DIAGNOSIS  Your caregiver may suspect you have gastritis based on your symptoms and a physical exam. To determine the cause of your gastritis, your caregiver may perform the following:  Blood or stool tests to check for the H pylori bacterium.  Gastroscopy. A thin, flexible tube (endoscope) is passed down the esophagus and into the stomach. The endoscope has a light and camera on the end. Your caregiver uses the endoscope to view the inside of the stomach.  Taking a tissue sample (biopsy) from the stomach to examine under a microscope. TREATMENT  Depending on the cause of your gastritis, medicines may be prescribed. If you have a bacterial infection, such as an H  pylori infection, antibiotics may be given. If your gastritis is caused by too much acid in the stomach, H2 blockers or antacids may be given. Your caregiver may recommend that you stop taking aspirin, ibuprofen, or other nonsteroidal anti-inflammatory drugs (NSAIDs). HOME CARE INSTRUCTIONS  Only take over-the-counter or prescription medicines as directed by your caregiver.  If you were given antibiotic medicines, take them as directed. Finish them even if you start to feel better.  Drink enough fluids to keep your urine clear or pale yellow.  Avoid foods and drinks that make your symptoms worse, such as:  Caffeine or alcoholic drinks.  Chocolate.  Peppermint or mint flavorings.  Garlic and onions.  Spicy foods.  Citrus fruits, such as oranges, lemons, or limes.  Tomato-based foods such as sauce, chili, salsa, and pizza.  Fried and fatty foods.  Eat small, frequent meals instead of large meals. SEEK IMMEDIATE MEDICAL CARE IF:   You have black or dark red stools.  You vomit blood or material that looks like coffee grounds.  You are unable to keep fluids down.  Your abdominal pain gets worse.  You have a fever.  You do not feel better after 1 week.  You have any other questions or concerns. MAKE SURE YOU:  Understand these instructions.  Will watch your condition.  Will get help right away if you are not doing well or get worse.   This information is not intended to replace advice given to you by your health care provider. Make sure you discuss any questions you have with your health care provider.   Document Released: 03/05/2001  Document Revised: 09/10/2011 Document Reviewed: 04/24/2011 Elsevier Interactive Patient Education [employer].

## 2015-04-22 NOTE — ED Provider Notes (Signed)
CSN: [id number]     Arrival date & time 04/17/15  1414 History   First MD Initiated Contact with Patient 04/17/15 1749     Chief Complaint  Patient presents with  . Hematemesis     (Consider location/radiation/quality/duration/timing/severity/associated sxs/prior Treatment) HPI   25 year old male with hematemesis. Patient reports nausea and vomiting ongoing for the past 2 weeks. Intermittently has noticed what appears to be blood in it. Patient reports he was evaluated at [facility] for the same complaint recently. Per his description, it sounds like he had a gastric lavage. He says they told him that they did not note any blood. He said continued nausea, gnawing epigastric pain and occasional emesis with blood in it since last evaluation. Note patient has previously had significant GI bleed. EGD required injection epinephrine and clipping of Mallory-Weiss tear. At that time he was using NSAIDs heavily. He reports that he continues to use BC powder every day up to several times per day. Denies significant alcohol use. No blood in stool or melena.  Past Medical History  Diagnosis Date  . Knee pain   . Hematemesis    Past Surgical History  Procedure Laterality Date  . None    . Esophagogastroduodenoscopy N/A 06/25/2013    Procedure: ESOPHAGOGASTRODUODENOSCOPY (EGD);  Surgeon: West Bali, MD;  Location: [facility];  Service: Endoscopy;  Laterality: N/A;   No family history on file. Social History  Substance Use Topics  . Smoking status: Never Smoker   . Smokeless tobacco: None  . Alcohol Use: No     Comment: Last used alcohol when it snowed, stating he did a shot.    Review of Systems  All systems reviewed and negative, other than as noted in HPI.   Allergies  Review of patient's allergies indicates no known allergies.  Home Medications   Prior to Admission medications   Medication Sig Start Date End Date Taking? Authorizing Provider  pantoprazole (PROTONIX)  20 MG tablet Take 1 tablet (20 mg total) by mouth 2 (two) times daily. 04/17/15   Raeford Razor, MD   BP 117/80 mmHg  Pulse 95  Temp(Src) 98.3 F (36.8 C) (Oral)  Resp 16  Ht  (1.575 m)  Wt 150 lb (68.04 kg)  BMI 27.43 kg/m2  SpO2 99% Physical Exam  Constitutional: He appears well-developed and well-nourished. No distress.  HENT:  Head: Normocephalic and atraumatic.  Eyes: Conjunctivae are normal. Right eye exhibits no discharge. Left eye exhibits no discharge.  Neck: Neck supple.  Cardiovascular: Normal rate, regular rhythm and normal heart sounds.  Exam reveals no gallop and no friction rub.   No murmur heard. Pulmonary/Chest: Effort normal and breath sounds normal. No respiratory distress.  Abdominal: Soft. He exhibits no distension. There is no tenderness.  Musculoskeletal: He exhibits no edema or tenderness.  Neurological: He is alert.  Skin: Skin is warm and dry.  Psychiatric: He has a normal mood and affect. His behavior is normal. Thought content normal.  Nursing note and vitals reviewed.   ED Course  Procedures (including critical care time) Labs Review Labs Reviewed  CBC WITH DIFFERENTIAL/PLATELET - Abnormal; Notable for the following:    Platelets 433 (*)    All other components within normal limits  COMPREHENSIVE METABOLIC PANEL - Abnormal; Notable for the following:    Total Protein 6.4 (*)    All other components within normal limits  LIPASE, BLOOD    Imaging Review No results found. I have personally reviewed and evaluated  these images and lab results as part of my medical decision-making.   EKG Interpretation None      MDM   Final diagnoses:  Hematemesis, presence of nausea not specified  NSAID induced gastritis    25 year old male with hematemesis. Patient has a past history of Mallory-Weiss tear requiring intervention. He continues to use BC powder on a daily basis. Again stressed that he needs to stop or at least significantly limit use  of NSAIDs. Will place on a PPI. He is hemodynamically stable. H&H is normal with symptoms ongoing for approximately 2 weeks. Very low suspicion for serious GI hemorrhage.    Raeford Razor, MD 04/22/15 [phone]

## 2015-05-04 ENCOUNTER — Encounter (HOSPITAL_COMMUNITY): Payer: Self-pay | Admitting: Emergency Medicine

## 2015-05-04 ENCOUNTER — Emergency Department (HOSPITAL_COMMUNITY): Payer: Medicaid Other

## 2015-05-04 ENCOUNTER — Emergency Department (HOSPITAL_COMMUNITY)
Admission: EM | Admit: 2015-05-04 | Discharge: 2015-05-04 | Disposition: A | Discharge status: Home / Self Care | Payer: Medicaid Other | Attending: Emergency Medicine | Admitting: Emergency Medicine

## 2015-05-04 DIAGNOSIS — K920 Hematemesis: Secondary | ICD-10-CM | POA: Insufficient documentation

## 2015-05-04 LAB — COMPREHENSIVE METABOLIC PANEL
ALBUMIN: 4 g/dL (ref 3.5–5.0)
ALT: 23 U/L (ref 17–63)
AST: 23 U/L (ref 15–41)
Alkaline Phosphatase: 50 U/L (ref 38–126)
Anion gap: 9 (ref 5–15)
BUN: 10 mg/dL (ref 6–20)
CO2: 28 mmol/L (ref 22–32)
CREATININE: 0.78 mg/dL (ref 0.61–1.24)
Calcium: 8.9 mg/dL (ref 8.9–10.3)
Chloride: 103 mmol/L (ref 101–111)
GFR calc Af Amer: >60 mL/min (ref >60)
GFR calc non Af Amer: >60 mL/min (ref >60)
GLUCOSE: 99 mg/dL (ref 65–99)
Potassium: 3.8 mmol/L (ref 3.5–5.1)
SODIUM: 140 mmol/L (ref 135–145)
Total Bilirubin: 0.7 mg/dL (ref 0.3–1.2)
Total Protein: 6.6 g/dL (ref 6.5–8.1)

## 2015-05-04 LAB — CBC WITH DIFFERENTIAL/PLATELET
BASOS ABS: 0 10*3/uL (ref 0.0–0.1)
BASOS PCT: 0 %
EOS ABS: 0.1 10*3/uL (ref 0.0–0.7)
EOS PCT: 1 %
HCT: 42.6 % (ref 39.0–52.0)
HEMOGLOBIN: 14.3 g/dL (ref 13.0–17.0)
Lymphocytes Relative: 22 %
Lymphs Abs: 2.8 10*3/uL (ref 0.7–4.0)
MCH: 31 pg (ref 26.0–34.0)
MCHC: 33.6 g/dL (ref 30.0–36.0)
MCV: 92.2 fL (ref 78.0–100.0)
Monocytes Absolute: 0.8 10*3/uL (ref 0.1–1.0)
Monocytes Relative: 6 %
NEUTROS PCT: 71 %
Neutro Abs: 9.1 10*3/uL — ABNORMAL HIGH (ref 1.7–7.7)
Platelets: 383 10*3/uL (ref 150–400)
RBC: 4.62 MIL/uL (ref 4.22–5.81)
RDW: 12.4 % (ref 11.5–15.5)
WBC: 12.8 10*3/uL — AB (ref 4.0–10.5)

## 2015-05-04 MED ORDER — SODIUM CHLORIDE 0.9 % IV BOLUS (SEPSIS)
1000.0000 mL | Freq: Once | INTRAVENOUS | Status: AC
  Administered 2015-05-04: 1000 mL via INTRAVENOUS

## 2015-05-04 MED ORDER — ONDANSETRON 4 MG PO TBDP: ORAL_TABLET | ORAL | Status: DC

## 2015-05-04 MED ORDER — HYDROCODONE-ACETAMINOPHEN 5-325 MG PO TABS
1.0000 | ORAL_TABLET | Freq: Once | ORAL | Status: AC
  Administered 2015-05-04: 1 via ORAL
  Filled 2015-05-04: qty 1

## 2015-05-04 MED ORDER — PANTOPRAZOLE SODIUM 40 MG IV SOLR
40.0000 mg | Freq: Once | INTRAVENOUS | Status: AC
  Administered 2015-05-04: 40 mg via INTRAVENOUS
  Filled 2015-05-04: qty 40

## 2015-05-04 MED ORDER — DICYCLOMINE HCL 20 MG PO TABS: ORAL_TABLET | ORAL | Status: DC

## 2015-05-04 MED ORDER — ONDANSETRON HCL 4 MG/2ML IJ SOLN
4.0000 mg | Freq: Once | INTRAMUSCULAR | Status: AC
  Administered 2015-05-04: 4 mg via INTRAVENOUS
  Filled 2015-05-04: qty 2

## 2015-05-04 MED ORDER — HYDROCODONE-ACETAMINOPHEN 5-325 MG PO TABS: 1.0000 | ORAL_TABLET | Freq: Four times a day (QID) | ORAL | Status: DC | PRN

## 2015-05-04 MED ORDER — PANTOPRAZOLE SODIUM 20 MG PO TBEC: 20.0000 mg | DELAYED_RELEASE_TABLET | Freq: Every day | ORAL | Status: DC

## 2015-05-04 MED ORDER — ONDANSETRON 4 MG PO TBDP
4.0000 mg | ORAL_TABLET | Freq: Once | ORAL | Status: AC
  Administered 2015-05-04: 4 mg via ORAL
  Filled 2015-05-04: qty 1

## 2015-05-04 NOTE — ED Notes (Signed)
Pt vomiting up blood intermittent since Dec, worse today, pt has been treated and had surgery to esophagus 2014. Sore throat, headache, abdominal pain

## 2015-05-04 NOTE — Discharge Instructions (Signed)
Do not take any over-the-counter pain medicines except for Tylenol. Follow-up with Dr. Darrick Penna called our office tomorrow. Return for significant vomiting of blood

## 2015-05-04 NOTE — ED Notes (Signed)
Pt continues to [place] [facility] powder

## 2015-05-04 NOTE — ED Provider Notes (Signed)
CSN: [id number]     Arrival date & time 05/04/15  1611 History   First MD Initiated Contact with Patient 05/04/15 1632     Chief Complaint  Patient presents with  . Hematemesis     (Consider location/radiation/quality/duration/timing/severity/associated sxs/prior Treatment) Patient is a 25 y.o. male presenting with vomiting. The history is provided by the patient (Patient states he's been vomiting blood for a few days. He has been taking Goody powders for headaches almost on a daily basis for the last month. Patient has had a Mallory-Weiss tear before).  Emesis Severity:  Mild Timing:  Intermittent Quality:  Bright red blood Able to tolerate:  Liquids Progression:  Unchanged Associated symptoms: no abdominal pain, no diarrhea and no headaches     Past Medical History  Diagnosis Date  . Knee pain   . Hematemesis    Past Surgical History  Procedure Laterality Date  . None    . Esophagogastroduodenoscopy N/A 06/25/2013    Procedure: ESOPHAGOGASTRODUODENOSCOPY (EGD);  Surgeon: West Bali, MD;  Location: [facility];  Service: Endoscopy;  Laterality: N/A;   No family history on file. Social History  Substance Use Topics  . Smoking status: Never Smoker   . Smokeless tobacco: None  . Alcohol Use: No     Comment: Last used alcohol when it snowed, stating he did a shot.    Review of Systems  Constitutional: Negative for appetite change and fatigue.  HENT: Negative for congestion, ear discharge and sinus pressure.   Eyes: Negative for discharge.  Respiratory: Negative for cough.   Cardiovascular: Negative for chest pain.  Gastrointestinal: Positive for vomiting. Negative for abdominal pain and diarrhea.  Genitourinary: Negative for frequency and hematuria.  Musculoskeletal: Negative for back pain.  Skin: Negative for rash.  Neurological: Negative for seizures and headaches.  Psychiatric/Behavioral: Negative for hallucinations.      Allergies  Tramadol  Home  Medications   Prior to Admission medications   Medication Sig Start Date End Date Taking? Authorizing Provider  Aspirin-Salicylamide-Caffeine (BC HEADACHE) 325-95-16 MG TABS Take 1 packet by mouth daily as needed (for pain).   Yes Historical Provider, MD  dicyclomine (BENTYL) 20 MG tablet Take one every 6 hours for abdominal cramps 05/04/15   Bethann Berkshire, MD  HYDROcodone-acetaminophen (NORCO/VICODIN) 5-325 MG tablet Take 1 tablet by mouth every 6 (six) hours as needed. 05/04/15   Bethann Berkshire, MD  ondansetron (ZOFRAN ODT) 4 MG disintegrating tablet  ODT q4 hours prn nausea/vomit 05/04/15   Bethann Berkshire, MD  pantoprazole (PROTONIX) 20 MG tablet Take 1 tablet (20 mg total) by mouth daily. 05/04/15   Bethann Berkshire, MD   BP 113/59 mmHg  Pulse 98  Temp(Src) 98 F (36.7 C) (Oral)  Resp 20  Ht  (1.575 m)  Wt 159 lb (72.122 kg)  BMI 29.07 kg/m2  SpO2 99% Physical Exam  Constitutional: He is oriented to person, place, and time. He appears well-developed.  HENT:  Head: Normocephalic.  Eyes: Conjunctivae and EOM are normal. No scleral icterus.  Neck: Neck supple. No thyromegaly present.  Cardiovascular: Normal rate and regular rhythm.  Exam reveals no gallop and no friction rub.   No murmur heard. Pulmonary/Chest: No stridor. He has no wheezes. He has no rales. He exhibits no tenderness.  Abdominal: He exhibits no distension. There is no tenderness. There is no rebound.  Musculoskeletal: Normal range of motion. He exhibits no edema.  Lymphadenopathy:    He has no cervical adenopathy.  Neurological: He is  oriented to person, place, and time. He exhibits normal muscle tone. Coordination normal.  Skin: No rash noted. No erythema.  Psychiatric: He has a normal mood and affect. His behavior is normal.    ED Course  Procedures (including critical care time) Labs Review Labs Reviewed  CBC WITH DIFFERENTIAL/PLATELET - Abnormal; Notable for the following:    WBC 12.8 (*)    Neutro Abs 9.1  (*)    All other components within normal limits  COMPREHENSIVE METABOLIC PANEL    Imaging Review Dg Abd Acute W/chest  05/04/2015  CLINICAL DATA:  Intermittent vomiting since December, worse today. Abdomen pain. EXAM: DG ABDOMEN ACUTE W/ 1V CHEST COMPARISON:  July 01, 2013 FINDINGS: There is no evidence of dilated bowel loops or free intraperitoneal air. No radiopaque calculi or other significant radiographic abnormality is seen. Heart size and mediastinal contours are within normal limits. Both lungs are clear. There is scoliosis of spine. Moderate bowel content is identified in the colon. IMPRESSION: Negative abdominal radiographs. Moderate bowel content in the colon. No acute cardiopulmonary disease. Electronically Signed   By: Sherian Rein M.D.   On: 05/04/2015 17:33   I have personally reviewed and evaluated these images and lab results as part of my medical decision-making.   EKG Interpretation None      MDM   Final diagnoses:  Hematemesis with nausea    Patient vomiting blood probably from gastritis or another Mallory Weiss tear. He is not anemic he is not orthostatic he will be put on protonic given nausea medicine and pain medicine and is instructed to follow-up with his GI doctor    Bethann Berkshire, MD 05/04/15 [phone]

## 2015-10-09 ENCOUNTER — Emergency Department (HOSPITAL_COMMUNITY)
Admission: EM | Admit: 2015-10-09 | Discharge: 2015-10-09 | Disposition: A | Discharge status: Home / Self Care | Payer: Medicaid Other | Attending: Emergency Medicine | Admitting: Emergency Medicine

## 2015-10-09 ENCOUNTER — Encounter (HOSPITAL_COMMUNITY): Payer: Self-pay | Admitting: Emergency Medicine

## 2015-10-09 DIAGNOSIS — H16133 Photokeratitis, bilateral: Secondary | ICD-10-CM | POA: Diagnosis not present

## 2015-10-09 DIAGNOSIS — Z79899 Other long term (current) drug therapy: Secondary | ICD-10-CM | POA: Insufficient documentation

## 2015-10-09 DIAGNOSIS — H5713 Ocular pain, bilateral: Secondary | ICD-10-CM | POA: Diagnosis present

## 2015-10-09 MED ORDER — TETRACAINE HCL 0.5 % OP SOLN
2.0000 [drp] | Freq: Once | OPHTHALMIC | Status: DC
  Filled 2015-10-09: qty 4

## 2015-10-09 MED ORDER — FLUORESCEIN SODIUM 1 MG OP STRP: 2.0000 | ORAL_STRIP | Freq: Once | OPHTHALMIC | Status: DC

## 2015-10-09 MED ORDER — OXYCODONE-ACETAMINOPHEN 5-325 MG PO TABS
1.0000 | ORAL_TABLET | Freq: Once | ORAL | Status: AC
  Administered 2015-10-09: 1 via ORAL
  Filled 2015-10-09: qty 1

## 2015-10-09 MED ORDER — KETOROLAC TROMETHAMINE 0.5 % OP SOLN
1.0000 [drp] | Freq: Once | OPHTHALMIC | Status: AC
  Administered 2015-10-09: 1 [drp] via OPHTHALMIC
  Filled 2015-10-09: qty 5

## 2015-10-09 MED ORDER — TOBRAMYCIN 0.3 % OP SOLN
1.0000 [drp] | Freq: Once | OPHTHALMIC | Status: AC
  Administered 2015-10-09: 1 [drp] via OPHTHALMIC
  Filled 2015-10-09: qty 5

## 2015-10-09 MED ORDER — IBUPROFEN 800 MG PO TABS
800.0000 mg | ORAL_TABLET | Freq: Once | ORAL | Status: AC
  Administered 2015-10-09: 800 mg via ORAL
  Filled 2015-10-09: qty 1

## 2015-10-09 MED ORDER — HYDROCODONE-ACETAMINOPHEN 5-325 MG PO TABS: ORAL_TABLET | ORAL | Status: DC

## 2015-10-09 NOTE — Discharge Instructions (Signed)
How to Use Eye Drops and Eye Ointments  HOW TO APPLY EYE DROPS  Follow these steps when applying eye drops:  1. Wash your hands.  2. Tilt your head back.  3. Put a finger under your eye and use it to gently pull your lower lid downward. Keep that finger in place.  4. Using your other hand, hold the dropper between your thumb and index finger.  5. Position the dropper just over the edge of the lower lid. Hold it as close to your eye as you can without touching the dropper to your eye.  6. Steady your hand. One way to do this is to lean your index finger against your brow.  7. Look up.  8. Slowly and gently squeeze one drop of medicine into your eye.  9. Close your eye.  10. Place a finger between your lower eyelid and your nose. Press gently for 2 minutes. This increases the amount of time that the medicine is exposed to the eye. It also reduces side effects that can develop if the drop gets into the bloodstream through the nose.  HOW TO APPLY EYE OINTMENTS  Follow these steps when applying eye ointments:  1. Wash your hands.  2. Put a finger under your eye and use it to gently pull your lower lid downward. Keep that finger in place.  3. Using your other hand, place the tip of the tube between your thumb and index finger with the remaining fingers braced against your cheek or nose.  4. Hold the tube just over the edge of your lower lid without touching the tube to your lid or eyeball.  5. Look up.  6. Line the inner part of your lower lid with ointment.  7. Gently pull up on your upper lid and look down. This will force the ointment to spread over the surface of the eye.  8. Release the upper lid.  9. If you can, close your eyes for 1-2 minutes.  Do not rub your eyes. If you applied the ointment correctly, your vision will be blurry for a few minutes. This is normal.  ADDITIONAL INFORMATION   Make sure to use the eye drops or ointment as told by your health care provider.   If you have been told to use both eye  drops and an eye ointment, apply the eye drops first, then wait 3-4 minutes before you apply the ointment.   Try not to touch the tip of the dropper or tube to your eye. A dropper or tube that has touched the eye can become contaminated.     This information is not intended to replace advice given to you by your health care provider. Make sure you discuss any questions you have with your health care provider.     Document Released: 06/17/2000 Document Revised: 07/26/2014 Document Reviewed: 03/07/2014  Elsevier Interactive Patient Education 2016 [employer]
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
## 2015-10-20 ENCOUNTER — Emergency Department (HOSPITAL_COMMUNITY)
Admission: EM | Admit: 2015-10-20 | Discharge: 2015-10-20 | Disposition: A | Discharge status: Home / Self Care | Payer: Medicaid Other | Attending: Emergency Medicine | Admitting: Emergency Medicine

## 2015-10-20 ENCOUNTER — Encounter (HOSPITAL_COMMUNITY): Payer: Self-pay

## 2015-10-20 ENCOUNTER — Emergency Department (HOSPITAL_COMMUNITY): Payer: Medicaid Other

## 2015-10-20 DIAGNOSIS — S0081XA Abrasion of other part of head, initial encounter: Secondary | ICD-10-CM | POA: Insufficient documentation

## 2015-10-20 DIAGNOSIS — S0083XA Contusion of other part of head, initial encounter: Secondary | ICD-10-CM | POA: Insufficient documentation

## 2015-10-20 DIAGNOSIS — S0990XA Unspecified injury of head, initial encounter: Secondary | ICD-10-CM | POA: Diagnosis present

## 2015-10-20 DIAGNOSIS — Z79899 Other long term (current) drug therapy: Secondary | ICD-10-CM | POA: Diagnosis not present

## 2015-10-20 DIAGNOSIS — Y9355 Activity, bike riding: Secondary | ICD-10-CM | POA: Diagnosis not present

## 2015-10-20 DIAGNOSIS — M542 Cervicalgia: Secondary | ICD-10-CM | POA: Insufficient documentation

## 2015-10-20 DIAGNOSIS — Y92410 Unspecified street and highway as the place of occurrence of the external cause: Secondary | ICD-10-CM | POA: Diagnosis not present

## 2015-10-20 DIAGNOSIS — Y999 Unspecified external cause status: Secondary | ICD-10-CM | POA: Insufficient documentation

## 2015-10-20 DIAGNOSIS — R109 Unspecified abdominal pain: Secondary | ICD-10-CM | POA: Diagnosis not present

## 2015-10-20 MED ORDER — DIATRIZOATE MEGLUMINE & SODIUM 66-10 % PO SOLN
ORAL | Status: AC
  Administered 2015-10-20: 21:00:00
  Filled 2015-10-20: qty 30

## 2015-10-20 MED ORDER — ACETAMINOPHEN-CODEINE #3 300-30 MG PO TABS
2.0000 | ORAL_TABLET | Freq: Once | ORAL | Status: AC
  Administered 2015-10-20: 2 via ORAL
  Filled 2015-10-20: qty 2

## 2015-10-20 MED ORDER — IOPAMIDOL (ISOVUE-300) INJECTION 61%
100.0000 mL | Freq: Once | INTRAVENOUS | Status: AC | PRN
  Administered 2015-10-20: 100 mL via INTRAVENOUS

## 2015-10-20 MED ORDER — PROMETHAZINE HCL 12.5 MG PO TABS: 12.5000 mg | ORAL_TABLET | Freq: Four times a day (QID) | ORAL | 0 refills | Status: DC | PRN

## 2015-10-20 MED ORDER — ACETAMINOPHEN-CODEINE #3 300-30 MG PO TABS: 1.0000 | ORAL_TABLET | Freq: Four times a day (QID) | ORAL | 0 refills | Status: DC | PRN

## 2015-10-20 MED ORDER — PROMETHAZINE HCL 12.5 MG PO TABS
25.0000 mg | ORAL_TABLET | Freq: Once | ORAL | Status: AC
  Administered 2015-10-20: 25 mg via ORAL
  Filled 2015-10-20: qty 2
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
## 2015-10-20 NOTE — Discharge Instructions (Signed)
The CT scan of your head, neck, abdomen, and pelvis are all negative for acute problem. Your vital signs within normal limits.  was please use Tylenol Extra Strength for pain or discomfort. Please use Tylenol codeine for more severe pain. May use promethazine for nausea if needed. Promethazine and Tylenol codeine may cause drowsiness, please do not drive, operate machinery, drink alcohol, or participate in activities requiring concentration when taking either of these medications.
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
## 2016-02-26 ENCOUNTER — Emergency Department (HOSPITAL_COMMUNITY)
Admission: EM | Admit: 2016-02-26 | Discharge: 2016-02-26 | Disposition: A | Discharge status: Home / Self Care | Payer: Medicaid Other | Attending: Dermatology | Admitting: Dermatology

## 2016-02-26 ENCOUNTER — Encounter (HOSPITAL_COMMUNITY): Payer: Self-pay | Admitting: Emergency Medicine

## 2016-02-26 DIAGNOSIS — K920 Hematemesis: Secondary | ICD-10-CM | POA: Insufficient documentation

## 2016-02-26 DIAGNOSIS — Z5321 Procedure and treatment not carried out due to patient leaving prior to being seen by health care provider: Secondary | ICD-10-CM | POA: Insufficient documentation

## 2016-02-26 DIAGNOSIS — Z79899 Other long term (current) drug therapy: Secondary | ICD-10-CM | POA: Diagnosis not present

## 2016-02-26 LAB — CBC
HEMATOCRIT: 39.5 % (ref 39.0–52.0)
HEMOGLOBIN: 12.9 g/dL — AB (ref 13.0–17.0)
MCH: 29.8 pg (ref 26.0–34.0)
MCHC: 32.7 g/dL (ref 30.0–36.0)
MCV: 91.2 fL (ref 78.0–100.0)
Platelets: 328 10*3/uL (ref 150–400)
RBC: 4.33 MIL/uL (ref 4.22–5.81)
RDW: 12.2 % (ref 11.5–15.5)
WBC: 7.6 10*3/uL (ref 4.0–10.5)

## 2016-02-26 LAB — COMPREHENSIVE METABOLIC PANEL
ALT: 21 U/L (ref 17–63)
ANION GAP: 7 (ref 5–15)
AST: 23 U/L (ref 15–41)
Albumin: 4.2 g/dL (ref 3.5–5.0)
Alkaline Phosphatase: 56 U/L (ref 38–126)
BUN: 8 mg/dL (ref 6–20)
CHLORIDE: 105 mmol/L (ref 101–111)
CO2: 26 mmol/L (ref 22–32)
Calcium: 9 mg/dL (ref 8.9–10.3)
Creatinine, Ser: 1.07 mg/dL (ref 0.61–1.24)
GFR calc non Af Amer: >60 mL/min (ref >60)
Glucose, Bld: 113 mg/dL — ABNORMAL HIGH (ref 65–99)
Potassium: 3.3 mmol/L — ABNORMAL LOW (ref 3.5–5.1)
SODIUM: 138 mmol/L (ref 135–145)
Total Bilirubin: 0.6 mg/dL (ref 0.3–1.2)
Total Protein: 7.6 g/dL (ref 6.5–8.1)

## 2016-02-26 LAB — TYPE AND SCREEN
ABO/RH(D): A POS
Antibody Screen: NEGATIVE

## 2016-02-26 NOTE — ED Triage Notes (Signed)
PT states hx of gastritis and states starting having abdominal pain and hematemesis x6 today. PT states normal BM today and denies any urinary symptoms.

## 2016-02-26 NOTE — ED Notes (Signed)
Called to triage x 1with no answer  

## 2016-12-16 ENCOUNTER — Encounter (HOSPITAL_COMMUNITY): Payer: Self-pay | Admitting: Emergency Medicine

## 2016-12-16 ENCOUNTER — Emergency Department (HOSPITAL_COMMUNITY)
Admission: EM | Admit: 2016-12-16 | Discharge: 2016-12-16 | Disposition: A | Discharge status: Home / Self Care | Payer: Medicaid Other | Attending: Emergency Medicine | Admitting: Emergency Medicine

## 2016-12-16 DIAGNOSIS — K920 Hematemesis: Secondary | ICD-10-CM | POA: Diagnosis not present

## 2016-12-16 DIAGNOSIS — Z5321 Procedure and treatment not carried out due to patient leaving prior to being seen by health care provider: Secondary | ICD-10-CM | POA: Insufficient documentation

## 2016-12-16 HISTORY — DX: Pain in unspecified knee: M25.569

## 2016-12-16 HISTORY — DX: Headache: R51

## 2016-12-16 HISTORY — DX: Headache, unspecified: R51.9

## 2016-12-16 HISTORY — DX: Gastro-esophageal laceration-hemorrhage syndrome: K22.6

## 2016-12-16 HISTORY — DX: Other chronic pain: G89.29

## 2016-12-16 NOTE — ED Notes (Signed)
Called no answer
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
## 2021-04-10 ENCOUNTER — Encounter (HOSPITAL_COMMUNITY): Payer: Self-pay

## 2021-04-10 ENCOUNTER — Emergency Department (HOSPITAL_COMMUNITY)
Admission: EM | Admit: 2021-04-10 | Discharge: 2021-04-10 | Disposition: A | Discharge status: Home / Self Care | Payer: Medicaid Other | Attending: Emergency Medicine | Admitting: Emergency Medicine

## 2021-04-10 ENCOUNTER — Other Ambulatory Visit: Payer: Self-pay

## 2021-04-10 ENCOUNTER — Telehealth: Payer: Self-pay | Admitting: Gastroenterology

## 2021-04-10 DIAGNOSIS — R1110 Vomiting, unspecified: Secondary | ICD-10-CM

## 2021-04-10 DIAGNOSIS — K920 Hematemesis: Secondary | ICD-10-CM | POA: Diagnosis present

## 2021-04-10 DIAGNOSIS — Z79899 Other long term (current) drug therapy: Secondary | ICD-10-CM | POA: Diagnosis not present

## 2021-04-10 DIAGNOSIS — K226 Gastro-esophageal laceration-hemorrhage syndrome: Secondary | ICD-10-CM

## 2021-04-10 DIAGNOSIS — K922 Gastrointestinal hemorrhage, unspecified: Secondary | ICD-10-CM

## 2021-04-10 LAB — COMPREHENSIVE METABOLIC PANEL
ALT: 18 U/L (ref 0–44)
AST: 17 U/L (ref 15–41)
Albumin: 4 g/dL (ref 3.5–5.0)
Alkaline Phosphatase: 57 U/L (ref 38–126)
Anion gap: 8 (ref 5–15)
BUN: 10 mg/dL (ref 6–20)
CO2: 28 mmol/L (ref 22–32)
Calcium: 9.2 mg/dL (ref 8.9–10.3)
Chloride: 104 mmol/L (ref 98–111)
Creatinine, Ser: 0.67 mg/dL (ref 0.61–1.24)
GFR, Estimated: >60 mL/min (ref >60)
Glucose, Bld: 92 mg/dL (ref 70–99)
Potassium: 3.9 mmol/L (ref 3.5–5.1)
Sodium: 140 mmol/L (ref 135–145)
Total Bilirubin: 0.7 mg/dL (ref 0.3–1.2)
Total Protein: 7.5 g/dL (ref 6.5–8.1)

## 2021-04-10 LAB — CBC
HCT: 41.4 % (ref 39.0–52.0)
Hemoglobin: 13.4 g/dL (ref 13.0–17.0)
MCH: 30.8 pg (ref 26.0–34.0)
MCHC: 32.4 g/dL (ref 30.0–36.0)
MCV: 95.2 fL (ref 80.0–100.0)
Platelets: 357 10*3/uL (ref 150–400)
RBC: 4.35 MIL/uL (ref 4.22–5.81)
RDW: 12.3 % (ref 11.5–15.5)
WBC: 7.6 10*3/uL (ref 4.0–10.5)
nRBC: 0 % (ref 0.0–0.2)

## 2021-04-10 LAB — TYPE AND SCREEN
ABO/RH(D): A POS
Antibody Screen: NEGATIVE

## 2021-04-10 MED ORDER — OMEPRAZOLE 20 MG PO CPDR: 20.0000 mg | DELAYED_RELEASE_CAPSULE | Freq: Two times a day (BID) | ORAL | 1 refills | Status: DC

## 2021-04-10 MED ORDER — PANTOPRAZOLE SODIUM 40 MG PO TBEC
40.0000 mg | DELAYED_RELEASE_TABLET | Freq: Once | ORAL | Status: AC
  Administered 2021-04-10: 40 mg via ORAL
  Filled 2021-04-10: qty 1

## 2021-04-10 NOTE — ED Triage Notes (Signed)
Pt reports vomiting "globs" of blood for the past 3 or 4 days.  Reports intermittent epigastric pain.  Reports history of same and had to have surgical clips put in esophagus.

## 2021-04-10 NOTE — Telephone Encounter (Signed)
Noted pt is scheduled for 1/19 @ 4 pm

## 2021-04-10 NOTE — Discharge Instructions (Signed)
Your evaluation today involves blood work which was totally normal, I do want you to follow-up with a gastroenterologist who I spoke with on the phone about your care today, Dr. Levon Hedger, he will see you in the office.  Please make a phone call in the morning to make this arrangement.   He has asked that you start taking this medication twice a day, omeprazole, please stop smoking marijuana as this can lead to excessive vomiting and make sure that you are drinking plenty of clear liquids.   If you should develop severe or worsening bleeding pain or any other worsening or concerning symptoms return to the emergency department immediately

## 2021-04-10 NOTE — Telephone Encounter (Signed)
Patient presented to the ED today with reports of hematemesis x 3-4 days. Hgb wnl. BUN normal. Dr. Jenetta Downer recommended PPI BID and outpatient follow-up to arrange EGD.   Dr. Abbey Chatters has an opening at 4 pm on 1/19. Confirmed with patient that he will be able to come at that time.   Spoke with Loma Sousa, CMA at [facility] who scheduled patient.

## 2021-04-10 NOTE — ED Provider Notes (Signed)
[facility] EMERGENCY DEPARTMENT Provider Note   CSN: [id number] Arrival date & time: 04/10/21  9476     History  Chief Complaint  Patient presents with   Hematemesis    James Boyle is a 31 y.o. male.  HPI  This patient is a 31 year old male who has a history of opiate abuse currently on Suboxone, he has a history of an upper GI bleed as recorded in the medical record in 2015 during which time the patient required clipping of areas at the gastroesophageal junction due to Mallory-Weiss tears.  He also required a blood transfusion at that time due to the amount of lost blood.  Since that time he has been relatively stable but about a month ago started to develop recurrent vomiting.  This is mostly in the morning where he states every single morning when he gets up he vomits, he does occasionally vomit throughout the day, it is mostly yellow stomach acid mixed with the occasional blood streak or speckled blood or blood clots.  This morning he saw it again and decided to come get checked.  He does not have any chest pain or abdominal pain, he does have a feeling of acid reflux in his throat.  He denies weakness shortness of breath headache fevers chills or diarrhea.  Home Medications Prior to Admission medications   Medication Sig Start Date End Date Taking? Authorizing Provider  buprenorphine-naloxone (SUBOXONE) 8-2 mg SUBL SL tablet Place 1 tablet under the tongue 2 (two) times daily.   Yes [provider]  omeprazole (PRILOSEC) 20 MG capsule Take 1 capsule (20 mg total) by mouth 2 (two) times daily before a meal. 04/10/21  Yes Eber Hong, MD  acetaminophen-codeine (TYLENOL #3) 300-30 MG tablet Take 1-2 tablets by mouth every 6 (six) hours as needed for moderate pain. Patient not taking: Reported on 04/10/2021 10/20/15   Ivery Quale, PA-C  promethazine (PHENERGAN) 12.5 MG tablet Take 1 tablet (12.5 mg total) by mouth every 6 (six) hours as needed for nausea or  vomiting. Patient not taking: Reported on 04/10/2021 10/20/15   Ivery Quale, PA-C      Allergies    Motrin [ibuprofen] and Tramadol    Review of Systems   Review of Systems  All other systems reviewed and are negative.  Physical Exam Updated Vital Signs BP 112/71    Pulse 78    Temp 98.1 F (36.7 C) (Oral)    Resp 18    Ht 1.575 m (5\' 2" )    Wt 72.6 kg    SpO2 97%    BMI 29.26 kg/m  Physical Exam Vitals and nursing note reviewed.  Constitutional:      General: He is not in acute distress.    Appearance: He is well-developed.  HENT:     Head: Normocephalic and atraumatic.     Mouth/Throat:     Pharynx: No oropharyngeal exudate.     Comments: Oropharynx is clear Eyes:     General: No scleral icterus.       Right eye: No discharge.        Left eye: No discharge.     Conjunctiva/sclera: Conjunctivae normal.     Pupils: Pupils are equal, round, and reactive to light.  Neck:     Thyroid: No thyromegaly.     Vascular: No JVD.  Cardiovascular:     Rate and Rhythm: Normal rate and regular rhythm.     Heart sounds: Normal heart sounds. No murmur heard.  No friction rub. No gallop.  Pulmonary:     Effort: Pulmonary effort is normal. No respiratory distress.     Breath sounds: Normal breath sounds. No wheezing or rales.  Abdominal:     General: Bowel sounds are normal. There is no distension.     Palpations: Abdomen is soft. There is no mass.     Tenderness: There is no abdominal tenderness.  Musculoskeletal:        General: No tenderness. Normal range of motion.     Cervical back: Normal range of motion and neck supple.  Lymphadenopathy:     Cervical: No cervical adenopathy.  Skin:    General: Skin is warm and dry.     Findings: No erythema or rash.  Neurological:     Mental Status: He is alert.     Coordination: Coordination normal.  Psychiatric:        Behavior: Behavior normal.    ED Results / Procedures / Treatments   Labs (all labs ordered are listed, but  only abnormal results are displayed) Labs Reviewed  COMPREHENSIVE METABOLIC PANEL  CBC  TYPE AND SCREEN    EKG None  Radiology No results found.  Procedures Procedures    Medications Ordered in ED Medications  pantoprazole (PROTONIX) EC tablet 40 mg (40 mg Oral Given 04/10/21 1130)    ED Course/ Medical Decision Making/ A&P                           Medical Decision Making Ultimately this patient does not appear to be in distress his vital signs are normal but he has a history significant for recurrent vomiting likely related to acid reflux although he does smoke marijuana regularly and this may have some effect with some hyperemesis.  He has absolutely no abdominal pain, he is not tachycardic or hypotensive.  We will check a CBC basic metabolic panel and discuss with gastroenterology.  Problems Addressed: Mallory-Weiss tear: acute illness or injury    Details: This seems to be a recurrent condition, may be brought on by excessive marijuana use or acid reflux, discussed with gastroenterology who made recommendations about PPI, given in the emergency department UGI bleed:    Details: Please see above notes regarding Mallory-Weiss tear and upper GI bleed, patient has normal hemoglobin stable for discharge  Amount and/or Complexity of Data Reviewed External Data Reviewed: notes.    Details: Prior endoscopy reports Labs: ordered.    Details: My interpretation shows that there is a normal CBC without anemia Discussion of management or test interpretation with external provider(s): Given omeprazole prescription for discharge  Risk Prescription drug management. Risk Details: Social determinants of health: The patient does smoke marijuana          Final Clinical Impression(s) / ED Diagnoses Final diagnoses:  Mallory-Weiss tear  Recurrent vomiting  UGI bleed    Rx / DC Orders ED Discharge Orders          Ordered    omeprazole (PRILOSEC) 20 MG capsule  2 times  daily before meals        04/10/21 1159              Eber Hong, MD 04/10/21 1201

## 2021-04-12 ENCOUNTER — Ambulatory Visit: Payer: Medicaid Other | Admitting: Internal Medicine

## 2021-05-16 ENCOUNTER — Encounter (HOSPITAL_COMMUNITY): Payer: Self-pay

## 2021-05-16 ENCOUNTER — Emergency Department (HOSPITAL_COMMUNITY)
Admission: EM | Admit: 2021-05-16 | Discharge: 2021-05-16 | Disposition: A | Discharge status: Home / Self Care | Payer: Medicaid Other | Attending: Emergency Medicine | Admitting: Emergency Medicine

## 2021-05-16 ENCOUNTER — Other Ambulatory Visit: Payer: Self-pay

## 2021-05-16 DIAGNOSIS — G43009 Migraine without aura, not intractable, without status migrainosus: Secondary | ICD-10-CM | POA: Insufficient documentation

## 2021-05-16 DIAGNOSIS — R519 Headache, unspecified: Secondary | ICD-10-CM | POA: Diagnosis present

## 2021-05-16 LAB — CBC WITH DIFFERENTIAL/PLATELET
Abs Immature Granulocytes: 0.02 10*3/uL (ref 0.00–0.07)
Basophils Absolute: 0 10*3/uL (ref 0.0–0.1)
Basophils Relative: 0 %
Eosinophils Absolute: 0 10*3/uL (ref 0.0–0.5)
Eosinophils Relative: 0 %
HCT: 40.5 % (ref 39.0–52.0)
Hemoglobin: 13 g/dL (ref 13.0–17.0)
Immature Granulocytes: 0 %
Lymphocytes Relative: 16 %
Lymphs Abs: 1.2 10*3/uL (ref 0.7–4.0)
MCH: 29.9 pg (ref 26.0–34.0)
MCHC: 32.1 g/dL (ref 30.0–36.0)
MCV: 93.1 fL (ref 80.0–100.0)
Monocytes Absolute: 0.3 10*3/uL (ref 0.1–1.0)
Monocytes Relative: 4 %
Neutro Abs: 5.8 10*3/uL (ref 1.7–7.7)
Neutrophils Relative %: 80 %
Platelets: 343 10*3/uL (ref 150–400)
RBC: 4.35 MIL/uL (ref 4.22–5.81)
RDW: 12.2 % (ref 11.5–15.5)
WBC: 7.3 10*3/uL (ref 4.0–10.5)
nRBC: 0 % (ref 0.0–0.2)

## 2021-05-16 LAB — COMPREHENSIVE METABOLIC PANEL
ALT: 21 U/L (ref 0–44)
AST: 22 U/L (ref 15–41)
Albumin: 4.2 g/dL (ref 3.5–5.0)
Alkaline Phosphatase: 62 U/L (ref 38–126)
Anion gap: 5 (ref 5–15)
BUN: 9 mg/dL (ref 6–20)
CO2: 27 mmol/L (ref 22–32)
Calcium: 9.1 mg/dL (ref 8.9–10.3)
Chloride: 103 mmol/L (ref 98–111)
Creatinine, Ser: 0.67 mg/dL (ref 0.61–1.24)
GFR, Estimated: >60 mL/min (ref >60)
Glucose, Bld: 113 mg/dL — ABNORMAL HIGH (ref 70–99)
Potassium: 3.7 mmol/L (ref 3.5–5.1)
Sodium: 135 mmol/L (ref 135–145)
Total Bilirubin: 0.7 mg/dL (ref 0.3–1.2)
Total Protein: 7.8 g/dL (ref 6.5–8.1)

## 2021-05-16 LAB — TYPE AND SCREEN
ABO/RH(D): A POS
Antibody Screen: NEGATIVE

## 2021-05-16 MED ORDER — METOCLOPRAMIDE HCL 5 MG/ML IJ SOLN
10.0000 mg | Freq: Once | INTRAMUSCULAR | Status: AC
  Administered 2021-05-16: 10 mg via INTRAVENOUS
  Filled 2021-05-16: qty 2

## 2021-05-16 MED ORDER — DIPHENHYDRAMINE HCL 50 MG/ML IJ SOLN
25.0000 mg | Freq: Once | INTRAMUSCULAR | Status: AC
  Administered 2021-05-16: 25 mg via INTRAVENOUS
  Filled 2021-05-16: qty 1

## 2021-05-16 MED ORDER — PANTOPRAZOLE SODIUM 40 MG IV SOLR
40.0000 mg | Freq: Once | INTRAVENOUS | Status: AC
  Administered 2021-05-16: 40 mg via INTRAVENOUS
  Filled 2021-05-16: qty 10

## 2021-05-16 MED ORDER — PANTOPRAZOLE SODIUM 40 MG PO TBEC: 40.0000 mg | DELAYED_RELEASE_TABLET | Freq: Every day | ORAL | 0 refills | Status: AC

## 2021-05-16 NOTE — Discharge Instructions (Signed)
Take the medication as directed.  Try to avoid any NSAIDs such as BC or Goody powders, ibuprofen, Advil or Aleve.  You may take Tylenol if needed for pain.  You may want to follow-up with a specialist regarding your headaches, I have listed some resources for you.  Return to the emergency department for any new or worsening symptoms.

## 2021-05-16 NOTE — ED Triage Notes (Signed)
Patient with complaints of headache for 2 days and vomiting with occasional blood seen.

## 2021-05-16 NOTE — ED Provider Notes (Signed)
[facility] EMERGENCY DEPARTMENT Provider Note   CSN: [id number] Arrival date & time: 05/16/21  4098     History  Chief Complaint  Patient presents with   Headache    James Boyle is a 31 y.o. male.   Headache Associated symptoms: nausea, vomiting and weakness (Generalized weakness)   Associated symptoms: no abdominal pain       James Boyle is a 31 y.o. male with past medical history of opiate abuse and currently on Suboxone, chronic headaches and upper GI bleed with Mallory-Weiss tears, clippings performed in 2015 who presents to the Emergency Department complaining of left-sided headache x2 days.  He describes the headache as gradual in onset and throbbing in quality.  Headache has been associated with some photophobia.  He states the headache is similar to previous migraine headaches.  He is also having nausea and vomiting with intermittent episodes of bright red blood in his vomitus.  He states at times the vomitus looks like "pure blood."  He also complains of some generalized weakness.  He is tolerating fluids without difficulty.  He denies any abdominal pain, dizziness, visual changes, neck pain or stiffness no fever or chills.  No chest pain or shortness of breath.   Home Medications Prior to Admission medications   Medication Sig Start Date End Date Taking? Authorizing Provider  acetaminophen-codeine (TYLENOL #3) 300-30 MG tablet Take 1-2 tablets by mouth every 6 (six) hours as needed for moderate pain. Patient not taking: Reported on 04/10/2021 10/20/15   Ivery Quale, PA-C  buprenorphine-naloxone (SUBOXONE) 8-2 mg SUBL SL tablet Place 1 tablet under the tongue 2 (two) times daily.    [provider]  omeprazole (PRILOSEC) 20 MG capsule Take 1 capsule (20 mg total) by mouth 2 (two) times daily before a meal. 04/10/21   Eber Hong, MD  promethazine (PHENERGAN) 12.5 MG tablet Take 1 tablet (12.5 mg total) by mouth every 6 (six) hours as needed for nausea or  vomiting. Patient not taking: Reported on 04/10/2021 10/20/15   Ivery Quale, PA-C      Allergies    Motrin [ibuprofen] and Tramadol    Review of Systems   Review of Systems  Respiratory:  Negative for shortness of breath.   Cardiovascular:  Negative for chest pain.  Gastrointestinal:  Positive for nausea and vomiting. Negative for abdominal distention and abdominal pain.  Genitourinary:  Negative for dysuria.  Neurological:  Positive for weakness (Generalized weakness) and headaches.  All other systems reviewed and are negative.  Physical Exam Updated Vital Signs BP 124/86 (BP Location: Left Arm)    Pulse 74    Temp 98 F (36.7 C) (Oral)    Resp 18    Ht 5\' 2"  (1.575 m)    Wt 72.6 kg    SpO2 96%    BMI 29.26 kg/m  Physical Exam Vitals and nursing note reviewed.  Constitutional:      General: He is not in acute distress.    Appearance: He is well-developed. He is not ill-appearing or toxic-appearing.  HENT:     Mouth/Throat:     Mouth: Mucous membranes are moist.     Pharynx: Oropharynx is clear.  Eyes:     Extraocular Movements: Extraocular movements intact.     Conjunctiva/sclera: Conjunctivae normal.     Pupils: Pupils are equal, round, and reactive to light.  Neck:     Meningeal: Kernig's sign absent.  Cardiovascular:     Rate and Rhythm: Normal rate and regular  rhythm.     Pulses: Normal pulses.  Pulmonary:     Effort: Pulmonary effort is normal.     Breath sounds: Normal breath sounds.  Abdominal:     General: There is no distension.     Palpations: Abdomen is soft.     Tenderness: There is no abdominal tenderness.  Musculoskeletal:        General: Normal range of motion.     Cervical back: Full passive range of motion without pain and normal range of motion. No rigidity or tenderness.  Skin:    General: Skin is warm.     Capillary Refill: Capillary refill takes less than 2 seconds.  Neurological:     General: No focal deficit present.     Mental Status: He  is alert.     Sensory: No sensory deficit.     Motor: No weakness.    ED Results / Procedures / Treatments   Labs (all labs ordered are listed, but only abnormal results are displayed) Labs Reviewed  COMPREHENSIVE METABOLIC PANEL - Abnormal; Notable for the following components:      Result Value   Glucose, Bld 113 (*)    All other components within normal limits  CBC WITH DIFFERENTIAL/PLATELET  TYPE AND SCREEN    EKG None  Radiology No results found.  Procedures Procedures    Medications Ordered in ED Medications - No data to display  ED Course/ Medical Decision Making/ A&P                           Medical Decision Making Patient here with history of migraine headaches and endorses headache x2 days.  States headache feels similar to previous migraines.  He also has history of upper GI bleed and Mallory-Weiss tears that required clipping's in 2015.  He endorses some intermittent episodes of vomiting bright red blood.  Endorses some generalized weakness but otherwise denies any changes from his baseline.  On exam, he is well-appearing does not appear to be in acute distress.  His vital signs are very reassuring he has no abdominal pain.  He is not actively vomiting.  There is no rigidity on exam and no focal neurodeficits.  Given his history of upper GI bleed, I will obtain labs including CBC and CMet.  On review of patient's meds, he does not currently take a PPI.  I feel that this would be beneficial.  We will address his headache here with Benadryl and Reglan.  IV dose of Protonix given.  Amount and/or Complexity of Data Reviewed Independent Historian:     Details: N/A External Data Reviewed: notes.    Details: Prior medical records reviewed by me Labs: ordered.    Details: Labs interpreted by me showno evidence of leukocytosis.  Hemoglobin is reassuring at 13.  Chemistry show blood sugar of 113 otherwise unremarkable.  Risk Prescription drug management.   On  recheck, pt resting comfortably.  Headache improved, reports feeling better and no vomiting during ER stay.  He has been very stable during his ER visit.  I do feel that he would benefit from taking a PPI I discussed this with him.  I will also provide resource information for neurology as he would likely benefit from better management of his migraine headaches.  No red flags to suggest emergent process.  He appears appropriate for discharge home, return precautions discussed.         Final Clinical Impression(s) / ED Diagnoses  Final diagnoses:  Migraine without aura and without status migrainosus, not intractable    Rx / DC Orders ED Discharge Orders     None         Pauline Aus, PA-C 05/16/21 1137    Blane Ohara, MD 05/16/21 1201

## 2021-06-26 ENCOUNTER — Other Ambulatory Visit: Payer: Self-pay

## 2021-06-26 ENCOUNTER — Emergency Department (HOSPITAL_COMMUNITY): Payer: Medicaid Other | Admitting: Certified Registered Nurse Anesthetist

## 2021-06-26 ENCOUNTER — Emergency Department (HOSPITAL_BASED_OUTPATIENT_CLINIC_OR_DEPARTMENT_OTHER): Payer: Medicaid Other | Admitting: Certified Registered Nurse Anesthetist

## 2021-06-26 ENCOUNTER — Encounter (HOSPITAL_COMMUNITY): Payer: Self-pay

## 2021-06-26 ENCOUNTER — Ambulatory Visit (HOSPITAL_COMMUNITY)
Admission: EM | Admit: 2021-06-26 | Discharge: 2021-06-26 | Disposition: A | Discharge status: Home / Self Care | Payer: Medicaid Other | Attending: Emergency Medicine | Admitting: Emergency Medicine

## 2021-06-26 ENCOUNTER — Emergency Department (HOSPITAL_COMMUNITY): Payer: Medicaid Other

## 2021-06-26 ENCOUNTER — Encounter (HOSPITAL_COMMUNITY)
Admission: EM | Disposition: A | Discharge status: Home / Self Care | Payer: Self-pay | Source: Home / Self Care | Attending: Emergency Medicine

## 2021-06-26 DIAGNOSIS — K353 Acute appendicitis with localized peritonitis, without perforation or gangrene: Secondary | ICD-10-CM

## 2021-06-26 DIAGNOSIS — K3580 Unspecified acute appendicitis: Secondary | ICD-10-CM

## 2021-06-26 HISTORY — PX: LAPAROSCOPIC APPENDECTOMY: SHX408

## 2021-06-26 LAB — CBC WITH DIFFERENTIAL/PLATELET
Abs Immature Granulocytes: 0.06 10*3/uL (ref 0.00–0.07)
Basophils Absolute: 0 10*3/uL (ref 0.0–0.1)
Basophils Relative: 0 %
Eosinophils Absolute: 0 10*3/uL (ref 0.0–0.5)
Eosinophils Relative: 0 %
HCT: 41.3 % (ref 39.0–52.0)
Hemoglobin: 13.2 g/dL (ref 13.0–17.0)
Immature Granulocytes: 1 %
Lymphocytes Relative: 11 %
Lymphs Abs: 1.4 10*3/uL (ref 0.7–4.0)
MCH: 29.9 pg (ref 26.0–34.0)
MCHC: 32 g/dL (ref 30.0–36.0)
MCV: 93.7 fL (ref 80.0–100.0)
Monocytes Absolute: 0.7 10*3/uL (ref 0.1–1.0)
Monocytes Relative: 5 %
Neutro Abs: 10.3 10*3/uL — ABNORMAL HIGH (ref 1.7–7.7)
Neutrophils Relative %: 83 %
Platelets: 369 10*3/uL (ref 150–400)
RBC: 4.41 MIL/uL (ref 4.22–5.81)
RDW: 12.3 % (ref 11.5–15.5)
WBC: 12.6 10*3/uL — ABNORMAL HIGH (ref 4.0–10.5)
nRBC: 0 % (ref 0.0–0.2)

## 2021-06-26 LAB — URINALYSIS, ROUTINE W REFLEX MICROSCOPIC
Bilirubin Urine: NEGATIVE
Glucose, UA: NEGATIVE mg/dL
Hgb urine dipstick: NEGATIVE
Ketones, ur: NEGATIVE mg/dL
Leukocytes,Ua: NEGATIVE
Nitrite: NEGATIVE
Protein, ur: NEGATIVE mg/dL
Specific Gravity, Urine: >1.046 — ABNORMAL HIGH (ref 1.005–1.030)
pH: 7 (ref 5.0–8.0)

## 2021-06-26 LAB — COMPREHENSIVE METABOLIC PANEL
ALT: 39 U/L (ref 0–44)
AST: 41 U/L (ref 15–41)
Albumin: 4.5 g/dL (ref 3.5–5.0)
Alkaline Phosphatase: 83 U/L (ref 38–126)
Anion gap: 9 (ref 5–15)
BUN: 12 mg/dL (ref 6–20)
CO2: 27 mmol/L (ref 22–32)
Calcium: 9.3 mg/dL (ref 8.9–10.3)
Chloride: 101 mmol/L (ref 98–111)
Creatinine, Ser: 0.66 mg/dL (ref 0.61–1.24)
GFR, Estimated: >60 mL/min (ref >60)
Glucose, Bld: 137 mg/dL — ABNORMAL HIGH (ref 70–99)
Potassium: 3.5 mmol/L (ref 3.5–5.1)
Sodium: 137 mmol/L (ref 135–145)
Total Bilirubin: 1.1 mg/dL (ref 0.3–1.2)
Total Protein: 8.1 g/dL (ref 6.5–8.1)

## 2021-06-26 SURGERY — APPENDECTOMY, LAPAROSCOPIC
Anesthesia: General | Site: Abdomen

## 2021-06-26 MED ORDER — MIDAZOLAM HCL 2 MG/2ML IJ SOLN
INTRAMUSCULAR | Status: AC
  Filled 2021-06-26: qty 2

## 2021-06-26 MED ORDER — LACTATED RINGERS IV SOLN
INTRAVENOUS | Status: DC
  Administered 2021-06-26: 1000 mL via INTRAVENOUS

## 2021-06-26 MED ORDER — PROPOFOL 10 MG/ML IV BOLUS
INTRAVENOUS | Status: DC | PRN
  Administered 2021-06-26: 120 mg via INTRAVENOUS
  Administered 2021-06-26: 50 mg via INTRAVENOUS

## 2021-06-26 MED ORDER — HYDROMORPHONE HCL 1 MG/ML IJ SOLN
1.0000 mg | Freq: Once | INTRAMUSCULAR | Status: AC
  Administered 2021-06-26: 1 mg via INTRAVENOUS
  Filled 2021-06-26: qty 1

## 2021-06-26 MED ORDER — PROPOFOL 10 MG/ML IV BOLUS
INTRAVENOUS | Status: AC
  Filled 2021-06-26: qty 20

## 2021-06-26 MED ORDER — ONDANSETRON HCL 4 MG/2ML IJ SOLN
4.0000 mg | Freq: Once | INTRAMUSCULAR | Status: AC
  Administered 2021-06-26: 4 mg via INTRAVENOUS

## 2021-06-26 MED ORDER — ONDANSETRON HCL 4 MG/2ML IJ SOLN
4.0000 mg | Freq: Once | INTRAMUSCULAR | Status: AC
  Administered 2021-06-26: 4 mg via INTRAVENOUS
  Filled 2021-06-26: qty 2

## 2021-06-26 MED ORDER — MIDAZOLAM HCL 5 MG/5ML IJ SOLN
INTRAMUSCULAR | Status: DC | PRN
  Administered 2021-06-26: 2 mg via INTRAVENOUS

## 2021-06-26 MED ORDER — SODIUM CHLORIDE 0.9 % IR SOLN
Status: DC | PRN
Start: 2021-06-26 — End: 2021-06-26
  Administered 2021-06-26: 1000 mL

## 2021-06-26 MED ORDER — HYDROMORPHONE HCL 1 MG/ML IJ SOLN
INTRAMUSCULAR | Status: AC
  Filled 2021-06-26: qty 0.5

## 2021-06-26 MED ORDER — CHLORHEXIDINE GLUCONATE CLOTH 2 % EX PADS: 6.0000 | MEDICATED_PAD | Freq: Once | CUTANEOUS | Status: DC

## 2021-06-26 MED ORDER — ONDANSETRON HCL 4 MG/2ML IJ SOLN
INTRAMUSCULAR | Status: AC
Start: 2021-06-26 — End: 2021-06-26
  Filled 2021-06-26: qty 2

## 2021-06-26 MED ORDER — ONDANSETRON HCL 4 MG PO TABS: 4.0000 mg | ORAL_TABLET | Freq: Three times a day (TID) | ORAL | 1 refills | Status: AC | PRN

## 2021-06-26 MED ORDER — SUCCINYLCHOLINE CHLORIDE 200 MG/10ML IV SOSY
PREFILLED_SYRINGE | INTRAVENOUS | Status: AC
  Filled 2021-06-26: qty 10

## 2021-06-26 MED ORDER — LIDOCAINE HCL (CARDIAC) PF 100 MG/5ML IV SOSY
PREFILLED_SYRINGE | INTRAVENOUS | Status: DC | PRN
  Administered 2021-06-26: 80 mg via INTRATRACHEAL

## 2021-06-26 MED ORDER — ORAL CARE MOUTH RINSE: 15.0000 mL | Freq: Once | OROMUCOSAL | Status: AC

## 2021-06-26 MED ORDER — ONDANSETRON HCL 4 MG/2ML IJ SOLN: 4.0000 mg | Freq: Once | INTRAMUSCULAR | Status: DC | PRN

## 2021-06-26 MED ORDER — IOHEXOL 300 MG/ML  SOLN
100.0000 mL | Freq: Once | INTRAMUSCULAR | Status: AC | PRN
  Administered 2021-06-26: 100 mL via INTRAVENOUS

## 2021-06-26 MED ORDER — MEPERIDINE HCL 50 MG/ML IJ SOLN: 6.2500 mg | INTRAMUSCULAR | Status: DC | PRN

## 2021-06-26 MED ORDER — CHLORHEXIDINE GLUCONATE 0.12 % MT SOLN
15.0000 mL | Freq: Once | OROMUCOSAL | Status: AC
  Administered 2021-06-26: 15 mL via OROMUCOSAL

## 2021-06-26 MED ORDER — SODIUM CHLORIDE 0.9 % IV BOLUS
1000.0000 mL | Freq: Once | INTRAVENOUS | Status: AC
  Administered 2021-06-26: 1000 mL via INTRAVENOUS

## 2021-06-26 MED ORDER — ONDANSETRON HCL 4 MG/2ML IJ SOLN
INTRAMUSCULAR | Status: DC | PRN
  Administered 2021-06-26: 4 mg via INTRAVENOUS

## 2021-06-26 MED ORDER — HYDROMORPHONE HCL 1 MG/ML IJ SOLN
0.2500 mg | INTRAMUSCULAR | Status: DC | PRN
  Administered 2021-06-26 (×3): 0.5 mg via INTRAVENOUS

## 2021-06-26 MED ORDER — OXYCODONE HCL 5 MG PO TABS: 5.0000 mg | ORAL_TABLET | ORAL | 0 refills | Status: DC | PRN

## 2021-06-26 MED ORDER — ROCURONIUM BROMIDE 100 MG/10ML IV SOLN
INTRAVENOUS | Status: DC | PRN
  Administered 2021-06-26: 40 mg via INTRAVENOUS

## 2021-06-26 MED ORDER — FENTANYL CITRATE PF 50 MCG/ML IJ SOSY
PREFILLED_SYRINGE | INTRAMUSCULAR | Status: AC
  Filled 2021-06-26: qty 1

## 2021-06-26 MED ORDER — FENTANYL CITRATE (PF) 100 MCG/2ML IJ SOLN
INTRAMUSCULAR | Status: AC
  Filled 2021-06-26: qty 2

## 2021-06-26 MED ORDER — BUPIVACAINE LIPOSOME 1.3 % IJ SUSP
INTRAMUSCULAR | Status: AC
  Filled 2021-06-26: qty 20

## 2021-06-26 MED ORDER — FENTANYL CITRATE PF 50 MCG/ML IJ SOSY
50.0000 ug | PREFILLED_SYRINGE | INTRAMUSCULAR | Status: DC | PRN
  Administered 2021-06-26: 50 ug via INTRAVENOUS

## 2021-06-26 MED ORDER — SUCCINYLCHOLINE CHLORIDE 200 MG/10ML IV SOSY
PREFILLED_SYRINGE | INTRAVENOUS | Status: DC | PRN
  Administered 2021-06-26: 100 mg via INTRAVENOUS

## 2021-06-26 MED ORDER — FENTANYL CITRATE (PF) 100 MCG/2ML IJ SOLN
INTRAMUSCULAR | Status: DC | PRN
  Administered 2021-06-26 (×2): 50 ug via INTRAVENOUS

## 2021-06-26 MED ORDER — DEXAMETHASONE SODIUM PHOSPHATE 4 MG/ML IJ SOLN
INTRAMUSCULAR | Status: DC | PRN
  Administered 2021-06-26: 10 mg via INTRAVENOUS

## 2021-06-26 MED ORDER — BUPIVACAINE LIPOSOME 1.3 % IJ SUSP
INTRAMUSCULAR | Status: DC | PRN
  Administered 2021-06-26: 20 mL

## 2021-06-26 MED ORDER — SODIUM CHLORIDE 0.9 % IV SOLN
2.0000 g | INTRAVENOUS | Status: AC
  Administered 2021-06-26: 2 g via INTRAVENOUS
  Filled 2021-06-26 (×2): qty 2

## 2021-06-26 MED ORDER — SUGAMMADEX SODIUM 200 MG/2ML IV SOLN
INTRAVENOUS | Status: DC | PRN
  Administered 2021-06-26: 290.4 mg via INTRAVENOUS

## 2021-06-26 SURGICAL SUPPLY — 46 items
ADH SKN CLS APL DERMABOND .7 (GAUZE/BANDAGES/DRESSINGS) ×1
APL PRP STRL LF DISP 70% ISPRP (MISCELLANEOUS) ×1
BAG RETRIEVAL 10 (BASKET) ×1
BLADE SURG 15 STRL LF DISP TIS (BLADE) ×1 IMPLANT
BLADE SURG 15 STRL SS (BLADE) ×2
CHLORAPREP W/TINT 26 (MISCELLANEOUS) ×2 IMPLANT
CLOTH BEACON ORANGE TIMEOUT ST (SAFETY) ×2 IMPLANT
COVER LIGHT HANDLE STERIS (MISCELLANEOUS) ×4 IMPLANT
CUTTER FLEX LINEAR 45M (STAPLE) ×2 IMPLANT
DERMABOND ADVANCED (GAUZE/BANDAGES/DRESSINGS) ×1
DERMABOND ADVANCED .7 DNX12 (GAUZE/BANDAGES/DRESSINGS) ×1 IMPLANT
ELECT REM PT RETURN 9FT ADLT (ELECTROSURGICAL) ×2
ELECTRODE REM PT RTRN 9FT ADLT (ELECTROSURGICAL) ×1 IMPLANT
GLOVE SURG ENC MOIS LTX SZ6.5 (GLOVE) ×2 IMPLANT
GLOVE SURG UNDER POLY LF SZ7 (GLOVE) ×8 IMPLANT
GOWN STRL REUS W/TWL LRG LVL3 (GOWN DISPOSABLE) ×4 IMPLANT
INST SET LAPROSCOPIC AP (KITS) ×2 IMPLANT
KIT TURNOVER KIT A (KITS) ×2 IMPLANT
MANIFOLD NEPTUNE II (INSTRUMENTS) ×2 IMPLANT
NDL HYPO 18GX1.5 BLUNT FILL (NEEDLE) ×1 IMPLANT
NDL HYPO 21X1.5 SAFETY (NEEDLE) ×1 IMPLANT
NDL INSUFFLATION 14GA 120MM (NEEDLE) ×1 IMPLANT
NEEDLE HYPO 18GX1.5 BLUNT FILL (NEEDLE) ×2 IMPLANT
NEEDLE HYPO 21X1.5 SAFETY (NEEDLE) ×2 IMPLANT
NEEDLE INSUFFLATION 14GA 120MM (NEEDLE) ×2 IMPLANT
NS IRRIG 1000ML POUR BTL (IV SOLUTION) ×2 IMPLANT
PACK LAP CHOLE LZT030E (CUSTOM PROCEDURE TRAY) ×2 IMPLANT
PAD ARMBOARD 7.5X6 YLW CONV (MISCELLANEOUS) ×2 IMPLANT
PENCIL HANDSWITCHING (ELECTRODE) ×1 IMPLANT
RELOAD STAPLE 45 3.5 BLU ETS (ENDOMECHANICALS) IMPLANT
RELOAD STAPLE TA45 3.5 REG BLU (ENDOMECHANICALS) ×2 IMPLANT
SET BASIN LINEN APH (SET/KITS/TRAYS/PACK) ×2 IMPLANT
SET TUBE SMOKE EVAC HIGH FLOW (TUBING) ×2 IMPLANT
SHEARS HARMONIC ACE PLUS 36CM (ENDOMECHANICALS) ×2 IMPLANT
SUT MNCRL AB 4-0 PS2 18 (SUTURE) ×4 IMPLANT
SUT VICRYL 0 UR6 27IN ABS (SUTURE) ×2 IMPLANT
SYR 20ML LL LF (SYRINGE) ×4 IMPLANT
SYS BAG RETRIEVAL 10MM (BASKET) ×1
SYSTEM BAG RETRIEVAL 10MM (BASKET) ×1 IMPLANT
TRAY FOLEY W/BAG SLVR 16FR (SET/KITS/TRAYS/PACK) ×2
TRAY FOLEY W/BAG SLVR 16FR ST (SET/KITS/TRAYS/PACK) ×1 IMPLANT
TROCAR ENDO BLADELESS 11MM (ENDOMECHANICALS) ×2 IMPLANT
TROCAR ENDO BLADELESS 12MM (ENDOMECHANICALS) ×2 IMPLANT
TROCAR XCEL NON-BLD 5MMX100MML (ENDOMECHANICALS) ×2 IMPLANT
TUBE CONNECTING 12X1/4 (SUCTIONS) ×2 IMPLANT
WARMER LAPAROSCOPE (MISCELLANEOUS) ×2 IMPLANT

## 2021-06-26 NOTE — Anesthesia Postprocedure Evaluation (Signed)
Anesthesia Post Note ? ?Patient: EON ZUNKER ? ?Procedure(s) Performed: APPENDECTOMY LAPAROSCOPIC (Abdomen) ? ?Patient location during evaluation: Phase II ?Anesthesia Type: General ?Level of consciousness: awake and alert and oriented ?Pain management: pain level controlled ?Vital Signs Assessment: post-procedure vital signs reviewed and stable ?Respiratory status: spontaneous breathing, nonlabored ventilation and respiratory function stable ?Cardiovascular status: blood pressure returned to baseline and stable ?Postop Assessment: no apparent nausea or vomiting ?Anesthetic complications: no ? ? ?No notable events documented. ? ? ?Last Vitals:  ?Vitals:  ? 06/26/21 1500 06/26/21 1521  ?BP: 108/69 111/74  ?Pulse: 70 97  ?Resp: 14 17  ?Temp:  36.7 ?C  ?SpO2: 95% 96%  ?  ?Last Pain:  ?Vitals:  ? 06/26/21 1521  ?TempSrc: Oral  ?PainSc: 3   ? ? ?  ?  ?  ?  ?  ?  ? ?Foster Frericks C Amiya Escamilla ? ? ? ? ?

## 2021-06-26 NOTE — H&P (Signed)
[facility] History and Physical ? ?Reason for Referral: Acute appendicitis  ?Referring Physician: Dr. Estell Harpin ? ?Chief Complaint   ?Abdominal Pain ?  ? ? ?James Boyle is a 31 y.o. male.  ?HPI: Mr. James Boyle started having pain at 1Am last night with associated nausea and vomiting. His pain has been in the RLQ and he had to come to the ED. He had a CT that demonstrated the appendix with inflammation consistent with appendicitis.  ? ?Past Medical History:  ?Diagnosis Date  ? Chronic headaches   ? Chronic knee pain   ? Hematemesis   ? Mallory-Weiss tear 2015  ? ? ?Past Surgical History:  ?Procedure Laterality Date  ? ESOPHAGOGASTRODUODENOSCOPY N/A 06/25/2013  ? Procedure: ESOPHAGOGASTRODUODENOSCOPY (EGD);  Surgeon: West Bali, MD;  Location: [facility];  Service: Endoscopy;  Laterality: N/A;  ? None    ? ? ?Family History  ?Problem Relation Age of Onset  ? Heart attack Maternal Grandfather   ? ? ?Social History  ? ?Tobacco Use  ? Smoking status: Never  ? Smokeless tobacco: Never  ?Substance Use Topics  ? Alcohol use: No  ?  Comment: Last used alcohol when it snowed, stating he did a shot.  ? Drug use: No  ? ? ?Medications: I have reviewed the patient's current medications. ?Medications Prior to Admission  ?Medication Sig Dispense Refill  ? buprenorphine (SUBUTEX) 8 MG SUBL SL tablet Place under the tongue daily.    ? pantoprazole (PROTONIX) 40 MG tablet Take 1 tablet (40 mg total) by mouth daily. 30 tablet 0  ? acetaminophen-codeine (TYLENOL #3) 300-30 MG tablet Take 1-2 tablets by mouth every 6 (six) hours as needed for moderate pain. (Patient not taking: Reported on 04/10/2021) 15 tablet 0  ? promethazine (PHENERGAN) 12.5 MG tablet Take 1 tablet (12.5 mg total) by mouth every 6 (six) hours as needed for nausea or vomiting. (Patient not taking: Reported on 04/10/2021) 12 tablet 0  ?  ?Allergies  ?Allergen Reactions  ? Motrin [Ibuprofen] Diarrhea  ? Tramadol Diarrhea and Nausea Only  ? ? ? ?ROS:  ?A  comprehensive review of systems was negative except for: Gastrointestinal: positive for abdominal pain, nausea, reflux symptoms, and vomiting ? ?Blood pressure 100/67, pulse 84, temperature 98 ?F (36.7 ?C), temperature source Oral, resp. rate 18, height 5\' 2"  (1.575 m), weight 72.6 kg, SpO2 98 %. ?Physical Exam ?Vitals reviewed.  ?Constitutional:   ?   Appearance: He is well-developed.  ?HENT:  ?   Head: Normocephalic.  ?Cardiovascular:  ?   Rate and Rhythm: Normal rate and regular rhythm.  ?Pulmonary:  ?   Effort: Pulmonary effort is normal.  ?Abdominal:  ?   General: There is no distension.  ?   Palpations: Abdomen is soft.  ?   Tenderness: There is abdominal tenderness.  ?Skin: ?   General: Skin is warm.  ?Neurological:  ?   General: No focal deficit present.  ?   Mental Status: He is alert and oriented to person, place, and time.  ?Psychiatric:     ?   Mood and Affect: Mood normal.     ?   Behavior: Behavior normal.  ? ? ?Results: ?Results for orders placed or performed during the hospital encounter of 06/26/21 (from the past 48 hour(s))  ?CBC with Differential/Platelet     Status: Abnormal  ? Collection Time: 06/26/21  8:08 AM  ?Result Value Ref Range  ? WBC 12.6 (H) 4.0 - 10.5 K/uL  ?  RBC 4.41 4.22 - 5.81 MIL/uL  ? Hemoglobin 13.2 13.0 - 17.0 g/dL  ? HCT 41.3 39.0 - 52.0 %  ? MCV 93.7 80.0 - 100.0 fL  ? MCH 29.9 26.0 - 34.0 pg  ? MCHC 32.0 30.0 - 36.0 g/dL  ? RDW 12.3 11.5 - 15.5 %  ? Platelets 369 150 - 400 K/uL  ? nRBC 0.0 0.0 - 0.2 %  ? Neutrophils Relative % 83 %  ? Neutro Abs 10.3 (H) 1.7 - 7.7 K/uL  ? Lymphocytes Relative 11 %  ? Lymphs Abs 1.4 0.7 - 4.0 K/uL  ? Monocytes Relative 5 %  ? Monocytes Absolute 0.7 0.1 - 1.0 K/uL  ? Eosinophils Relative 0 %  ? Eosinophils Absolute 0.0 0.0 - 0.5 K/uL  ? Basophils Relative 0 %  ? Basophils Absolute 0.0 0.0 - 0.1 K/uL  ? Immature Granulocytes 1 %  ? Abs Immature Granulocytes 0.06 0.00 - 0.07 K/uL  ?  Comment: Performed at [facility], [address],  [place], [place] [address]  ?Comprehensive metabolic panel     Status: Abnormal  ? Collection Time: 06/26/21  8:08 AM  ?Result Value Ref Range  ? Sodium 137 135 - 145 mmol/L  ? Potassium 3.5 3.5 - 5.1 mmol/L  ? Chloride 101 98 - 111 mmol/L  ? CO2 27 22 - 32 mmol/L  ? Glucose, Bld 137 (H) 70 - 99 mg/dL  ?  Comment: Glucose reference range applies only to samples taken after fasting for at least 8 hours.  ? BUN 12 6 - 20 mg/dL  ? Creatinine, Ser 0.66 0.61 - 1.24 mg/dL  ? Calcium 9.3 8.9 - 10.3 mg/dL  ? Total Protein 8.1 6.5 - 8.1 g/dL  ? Albumin 4.5 3.5 - 5.0 g/dL  ? AST 41 15 - 41 U/L  ? ALT 39 0 - 44 U/L  ? Alkaline Phosphatase 83 38 - 126 U/L  ? Total Bilirubin 1.1 0.3 - 1.2 mg/dL  ? GFR, Estimated >60 >60 mL/min  ?  Comment: (NOTE) ?Calculated using the CKD-EPI Creatinine Equation (2021) ?  ? Anion gap 9 5 - 15  ?  Comment: Performed at [facility], [address] [place], [place] [address]  ?Urinalysis, Routine w reflex microscopic Urine, Clean Catch     Status: Abnormal  ? Collection Time: 06/26/21  8:09 AM  ?Result Value Ref Range  ? Color, Urine YELLOW YELLOW  ? APPearance CLEAR CLEAR  ? Specific Gravity, Urine >1.046 (H) 1.005 - 1.030  ? pH 7.0 5.0 - 8.0  ? Glucose, UA NEGATIVE NEGATIVE mg/dL  ? Hgb urine dipstick NEGATIVE NEGATIVE  ? Bilirubin Urine NEGATIVE NEGATIVE  ? Ketones, ur NEGATIVE NEGATIVE mg/dL  ? Protein, ur NEGATIVE NEGATIVE mg/dL  ? Nitrite NEGATIVE NEGATIVE  ? Leukocytes,Ua NEGATIVE NEGATIVE  ?  Comment: Performed at [facility], [address] [place], [place] [address]  ? ?Appendix is dilated and inflamed  ?CT ABDOMEN PELVIS W CONTRAST ? ?Result Date: 06/26/2021 ?CLINICAL DATA:  Right lower quadrant abdominal pain EXAM: CT ABDOMEN AND PELVIS WITH CONTRAST TECHNIQUE: Multidetector CT imaging of the abdomen and pelvis was performed using the standard protocol following bolus administration of intravenous contrast. RADIATION DOSE REDUCTION: This exam was performed according to the departmental  dose-optimization program which includes automated exposure control, adjustment of the mA and/or kV according to patient size and/or use of iterative reconstruction technique. CONTRAST:  OMNIPAQUE IOHEXOL 300 MG/ML  SOLN COMPARISON:  CT abdomen and pelvis  dated October 20, 2015 FINDINGS: Lower chest: No acute abnormality. Hepatobiliary: No focal liver abnormality is seen. No gallstones, gallbladder wall thickening, or biliary dilatation. Pancreas: Unremarkable. No pancreatic ductal dilatation or surrounding inflammatory changes. Spleen: Normal in size without focal abnormality. Adrenals/Urinary Tract: Adrenal glands are unremarkable. Kidneys enhance symmetrically with no evidence of hydronephrosis or nephrolithiasis. Bilateral punctate nonobstructing renal stones. Stomach/Bowel: Stomach is within normal limits. Dilated appendix, measuring to 8 mm with surrounding inflammatory change. No free air or intra-abdominal fluid collections. No evidence of bowel wall thickening obstruction. Vascular/Lymphatic: No significant vascular findings are present. No enlarged abdominal or pelvic lymph nodes. Reproductive: Prostate is unremarkable. Other: No abdominal wall hernia or abnormality. No abdominopelvic ascites. Musculoskeletal: No acute or significant osseous findings. IMPRESSION: 1. Findings compatible with acute uncomplicated appendicitis. 2. Bilateral punctate nonobstructing renal stones. Electronically Signed   By: Allegra LaiLeah  Strickland M.D.   On: 06/26/2021 09:21   ? ? ?Assessment & Plan:  ?James Boyle is a 31 y.o. male with acute appendicitis.  ?-Discussed the risk of laparoscopic appendectomy and the option of antibiotics alone. Discussed that in [place] and some trials in the [place], antibiotics are used for simple appendicitis. Discussed that research shows a 40% failure rate for antibiotics alone.  Discussed risk of surgery including but not limited to bleeding, infection, injury to other organs, normal appendix, and  after this discussion the patient has decided to proceed with surgery. ? ? ?All questions were answered to the satisfaction of the patient. ? ? ?Lucretia RoersLindsay C Shabazz Mckey ?06/26/2021, 1:34 PM  ? ? ? ? ? ?

## 2021-06-26 NOTE — Progress Notes (Signed)
[facility] ? ?Updated wife. Left mom a VM. Roxicodone and zofran to pharmacy. He had been on subutex in the past but is not on that now. Wife confirms. [place] substance abuse shows that being filled in Nov 2022 but nothing since then.  ? ?Discussed use of tylenol and ibuprofen so he does not trigger his addiction. Roxicodone for breakthrough pain # 10 prescribed.  ?Will do post op phone call 4/18 and can go back to work 4/17. Letter given to patient. ? ?Future Appointments  ?Date Time Provider [place]  ?07/10/2021 12:05 PM Virl Cagey, MD [facility] None  ? ? ?Curlene Labrum, MD ?[facility] ?[place], [place] [address] ?[phone] (office) ? ?

## 2021-06-26 NOTE — Transfer of Care (Signed)
Immediate Anesthesia Transfer of Care Note ? ?Patient: James Boyle ? ?Procedure(s) Performed: APPENDECTOMY LAPAROSCOPIC (Abdomen) ? ?Patient Location: PACU ? ?Anesthesia Type:General ? ?Level of Consciousness: awake ? ?Airway & Oxygen Therapy: Patient Spontanous Breathing ? ?Post-op Assessment: Report given to RN and Post -op Vital signs reviewed and stable ? ?Post vital signs: Reviewed and stable ? ?Last Vitals:  ?Vitals Value Taken Time  ?BP 118/75 06/26/21 1425  ?Temp    ?Pulse 108 06/26/21 1426  ?Resp 17 06/26/21 1426  ?SpO2 97 % 06/26/21 1426  ?Vitals shown include unvalidated device data. ? ?Last Pain:  ?Vitals:  ? 06/26/21 1245  ?TempSrc:   ?PainSc: 8   ?   ? ?Patients Stated Pain Goal: 8 (06/26/21 1245) ? ?Complications: No notable events documented. ?

## 2021-06-26 NOTE — ED Notes (Signed)
Pt being picked up by OR. ?

## 2021-06-26 NOTE — Discharge Instructions (Signed)
Discharge Laparoscopic Surgery Instructions:  Common Complaints: Right shoulder pain is common after laparoscopic surgery. This is secondary to the gas used in the surgery being trapped under the diaphragm.  Walk to help your body absorb the gas. This will improve in a few days. Pain at the port sites are common, especially the larger port sites. This will improve with time.  Some nausea is common and poor appetite. The main goal is to stay hydrated the first few days after surgery.   Diet/ Activity: Diet as tolerated. You may not have an appetite, but it is important to stay hydrated. Drink 64 ounces of water a day. Your appetite will return with time.  Shower per your regular routine daily.  Do not take hot showers. Take warm showers that are less than 10 minutes. Rest and listen to your body, but do not remain in bed all day.  Walk everyday for at least 15-20 minutes. Deep cough and move around every 1-2 hours in the first few days after surgery.  Do not lift > 10 lbs, perform excessive bending, pushing, pulling, squatting for 1-2 weeks after surgery.  Do not pick at the dermabond glue on your incision sites.  This glue film will remain in place for 1-2 weeks and will start to peel off.  Do not place lotions or balms on your incision unless instructed to specifically by Dr. Ran Tullis.   Pain Expectations and Narcotics: -After surgery you will have pain associated with your incisions and this is normal. The pain is muscular and nerve pain, and will get better with time. -You are encouraged and expected to take non narcotic medications like tylenol and ibuprofen (when able) to treat pain as multiple modalities can aid with pain treatment. -Narcotics are only used when pain is severe or there is breakthrough pain. -You are not expected to have a pain score of 0 after surgery, as we cannot prevent pain. A pain score of 3-4 that allows you to be functional, move, walk, and tolerate some activity is  the goal. The pain will continue to improve over the days after surgery and is dependent on your surgery. -Due to [place] law, we are only able to give a certain amount of pain medication to treat post operative pain, and we only give additional narcotics on a patient by patient basis.  -For most laparoscopic surgery, studies have shown that the majority of patients only need 10-15 narcotic pills, and for open surgeries most patients only need 15-20.   -Having appropriate expectations of pain and knowledge of pain management with non narcotics is important as we do not want anyone to become addicted to narcotic pain medication.  -Using ice packs in the first 48 hours and heating pads after 48 hours, wearing an abdominal binder (when recommended), and using over the counter medications are all ways to help with pain management.   -Simple acts like meditation and mindfulness practices after surgery can also help with pain control and research has proven the benefit of these practices.  Medication: Take tylenol and ibuprofen as needed for pain control, alternating every 4-6 hours.  Example:  Tylenol 1000mg @ 6am, 12noon, 6pm, 12midnight (Do not exceed 4000mg of tylenol a day). Ibuprofen 800mg @ 9am, 3pm, 9pm, 3am (Do not exceed 3600mg of ibuprofen a day).  Take Roxicodone for breakthrough pain every 4 hours.  Take Colace for constipation related to narcotic pain medication. If you do not have a bowel movement in 2 days, take Miralax   over the counter.  Drink plenty of water to also prevent constipation.   Contact Information: If you have questions or concerns, please call our office, [phone], Monday- Thursday 8AM-5PM and Friday 8AM-12Noon.  If it is after hours or on the weekend, please call [facility] Main [facility], [phone], [phone], and ask to speak to the surgeon on call for Dr. Charmaine Placido at [facility].   

## 2021-06-26 NOTE — ED Notes (Signed)
Pt belongings in a bag with wedding ring.  ?

## 2021-06-26 NOTE — ED Triage Notes (Signed)
Patient complaining of RLQ abdominal pain that started last night, also has migraine for 2 days.  ?

## 2021-06-26 NOTE — Anesthesia Procedure Notes (Signed)
Procedure Name: Intubation ?Date/Time: 06/26/2021 1:45 PM ?Performed by: Minerva Ends, CRNA ?Pre-anesthesia Checklist: Patient identified, Emergency Drugs available, Suction available and Patient being monitored ?Patient Re-evaluated:Patient Re-evaluated prior to induction ?Oxygen Delivery Method: Circle system utilized ?Preoxygenation: Pre-oxygenation with 100% oxygen ?Induction Type: IV induction ?Ventilation: Mask ventilation without difficulty ?Laryngoscope Size: Mac and 3 ?Grade View: Grade I ?Tube type: Oral ?Tube size: 7.0 mm ?Number of attempts: 1 ?Airway Equipment and Method: Stylet and Oral airway ?Placement Confirmation: ETT inserted through vocal cords under direct vision, positive ETCO2 and breath sounds checked- equal and bilateral ?Secured at: 23 cm ?Tube secured with: Tape ?Dental Injury: Teeth and Oropharynx as per pre-operative assessment  ? ? ? ? ?

## 2021-06-26 NOTE — Anesthesia Preprocedure Evaluation (Addendum)
Anesthesia Evaluation  ?Patient identified by MRN, date of birth, ID band ?Patient awake ? ? ? ?Reviewed: ?Allergy & Precautions, NPO status , Patient's Chart, lab work & pertinent test results ? ?Airway ?Mallampati: II ? ?TM Distance: >3 FB ?Neck ROM: Full ? ? ? Dental ? ?(+) Dental Advisory Given, Chipped, Poor Dentition ?  ?Pulmonary ?neg pulmonary ROS,  ?  ?Pulmonary exam normal ?breath sounds clear to auscultation ? ? ? ? ? ? Cardiovascular ?negative cardio ROS ?Normal cardiovascular exam ?Rhythm:Regular Rate:Normal ? ? ?  ?Neuro/Psych ? Headaches, negative psych ROS  ? GI/Hepatic ?negative GI ROS, Neg liver ROS,   ?Endo/Other  ?negative endocrine ROS ? Renal/GU ?negative Renal ROS  ?negative genitourinary ?  ?Musculoskeletal ?negative musculoskeletal ROS ?(+)  ? Abdominal ?  ?Peds ?negative pediatric ROS ?(+)  Hematology ?negative hematology ROS ?(+)   ?Anesthesia Other Findings ? ? Reproductive/Obstetrics ?negative OB ROS ? ?  ? ? ? ? ? ? ? ? ? ? ? ? ? ?  ?  ? ? ? ? ? ? ? ?Anesthesia Physical ?Anesthesia Plan ? ?ASA: 2 ? ?Anesthesia Plan: General  ? ?Post-op Pain Management: Dilaudid IV  ? ?Induction: Intravenous and Rapid sequence ? ?PONV Risk Score and Plan:  ? ?Airway Management Planned: Oral ETT ? ?Additional Equipment:  ? ?Intra-op Plan:  ? ?Post-operative Plan: Extubation in OR ? ?Informed Consent: I have reviewed the patients History and Physical, chart, labs and discussed the procedure including the risks, benefits and alternatives for the proposed anesthesia with the patient or authorized representative who has indicated his/her understanding and acceptance.  ? ? ? ?Dental advisory given ? ?Plan Discussed with: CRNA and Surgeon ? ?Anesthesia Plan Comments:   ? ? ? ? ? ? ?Anesthesia Quick Evaluation ? ?

## 2021-06-26 NOTE — ED Provider Notes (Signed)
?[facility] EMERGENCY DEPARTMENT ?Provider Note ? ? ?CSN: [id number] ?Arrival date & time: 06/26/21  0735 ? ?  ? ?History ? ?Chief Complaint  ?Patient presents with  ? Abdominal Pain  ? ? ?James Boyle is a 31 y.o. male. ? ?Patient complains of a right lower quadrant pain that started this morning.  Patient has no other medical problems.  No pain in his back ? ?The history is provided by the patient and medical records. No language interpreter was used.  ?Abdominal Pain ?Pain location:  RLQ ?Pain quality: aching   ?Pain radiates to:  Does not radiate ?Pain severity:  Moderate ?Onset quality:  Sudden ?Timing:  Constant ?Progression:  Worsening ?Chronicity:  New ?Context: not alcohol use   ?Relieved by:  Nothing ?Worsened by:  Nothing ?Ineffective treatments:  None tried ?Associated symptoms: no anorexia, no chest pain, no cough, no diarrhea, no fatigue and no hematuria   ? ?  ? ?Home Medications ?Prior to Admission medications   ?Medication Sig Start Date End Date Taking? Authorizing Provider  ?acetaminophen-codeine (TYLENOL #3) 300-30 MG tablet Take 1-2 tablets by mouth every 6 (six) hours as needed for moderate pain. ?Patient not taking: Reported on 04/10/2021 10/20/15   Ivery QualeBryant, Hobson, PA-C  ?pantoprazole (PROTONIX) 40 MG tablet Take 1 tablet (40 mg total) by mouth daily. ?Patient not taking: Reported on 06/26/2021 05/16/21   Pauline Ausriplett, Tammy, PA-C  ?promethazine (PHENERGAN) 12.5 MG tablet Take 1 tablet (12.5 mg total) by mouth every 6 (six) hours as needed for nausea or vomiting. ?Patient not taking: Reported on 04/10/2021 10/20/15   Ivery QualeBryant, Hobson, PA-C  ?   ? ?Allergies    ?Motrin [ibuprofen] and Tramadol   ? ?Review of Systems   ?Review of Systems  ?Constitutional:  Negative for appetite change and fatigue.  ?HENT:  Negative for congestion, ear discharge and sinus pressure.   ?Eyes:  Negative for discharge.  ?Respiratory:  Negative for cough.   ?Cardiovascular:  Negative for chest pain.  ?Gastrointestinal:  Positive  for abdominal pain. Negative for anorexia and diarrhea.  ?Genitourinary:  Negative for frequency and hematuria.  ?Musculoskeletal:  Negative for back pain.  ?Skin:  Negative for rash.  ?Neurological:  Negative for seizures and headaches.  ?Psychiatric/Behavioral:  Negative for hallucinations.   ? ?Physical Exam ?Updated Vital Signs ?BP 126/83   Pulse 91   Temp 98.4 ?F (36.9 ?C) (Oral)   Resp 16   Ht 5\' 2"  (1.575 m)   Wt 72.6 kg   SpO2 96%   BMI 29.26 kg/m?  ?Physical Exam ?Vitals and nursing note reviewed.  ?Constitutional:   ?   Appearance: He is well-developed.  ?HENT:  ?   Head: Normocephalic.  ?   Mouth/Throat:  ?   Mouth: Mucous membranes are moist.  ?Eyes:  ?   General: No scleral icterus. ?   Conjunctiva/sclera: Conjunctivae normal.  ?Neck:  ?   Thyroid: No thyromegaly.  ?Cardiovascular:  ?   Rate and Rhythm: Normal rate and regular rhythm.  ?   Heart sounds: No murmur heard. ?  No friction rub. No gallop.  ?Pulmonary:  ?   Breath sounds: No stridor. No wheezing or rales.  ?Chest:  ?   Chest wall: No tenderness.  ?Abdominal:  ?   General: There is no distension.  ?   Tenderness: There is abdominal tenderness. There is no rebound.  ?Musculoskeletal:     ?   General: Normal range of motion.  ?   Cervical back:  Neck supple.  ?Lymphadenopathy:  ?   Cervical: No cervical adenopathy.  ?Skin: ?   Findings: No erythema or rash.  ?Neurological:  ?   Mental Status: He is alert and oriented to person, place, and time.  ?   Motor: No abnormal muscle tone.  ?   Coordination: Coordination normal.  ?Psychiatric:     ?   Behavior: Behavior normal.  ? ? ?ED Results / Procedures / Treatments   ?Labs ?(all labs ordered are listed, but only abnormal results are displayed) ?Labs Reviewed  ?CBC WITH DIFFERENTIAL/PLATELET - Abnormal; Notable for the following components:  ?    Result Value  ? WBC 12.6 (*)   ? Neutro Abs 10.3 (*)   ? All other components within normal limits  ?COMPREHENSIVE METABOLIC PANEL - Abnormal; Notable  for the following components:  ? Glucose, Bld 137 (*)   ? All other components within normal limits  ?URINALYSIS, ROUTINE W REFLEX MICROSCOPIC - Abnormal; Notable for the following components:  ? Specific Gravity, Urine >1.046 (*)   ? All other components within normal limits  ? ? ?EKG ?None ? ?Radiology ?CT ABDOMEN PELVIS W CONTRAST ? ?Result Date: 06/26/2021 ?CLINICAL DATA:  Right lower quadrant abdominal pain EXAM: CT ABDOMEN AND PELVIS WITH CONTRAST TECHNIQUE: Multidetector CT imaging of the abdomen and pelvis was performed using the standard protocol following bolus administration of intravenous contrast. RADIATION DOSE REDUCTION: This exam was performed according to the departmental dose-optimization program which includes automated exposure control, adjustment of the mA and/or kV according to patient size and/or use of iterative reconstruction technique. CONTRAST:  OMNIPAQUE IOHEXOL 300 MG/ML  SOLN COMPARISON:  CT abdomen and pelvis dated October 20, 2015 FINDINGS: Lower chest: No acute abnormality. Hepatobiliary: No focal liver abnormality is seen. No gallstones, gallbladder wall thickening, or biliary dilatation. Pancreas: Unremarkable. No pancreatic ductal dilatation or surrounding inflammatory changes. Spleen: Normal in size without focal abnormality. Adrenals/Urinary Tract: Adrenal glands are unremarkable. Kidneys enhance symmetrically with no evidence of hydronephrosis or nephrolithiasis. Bilateral punctate nonobstructing renal stones. Stomach/Bowel: Stomach is within normal limits. Dilated appendix, measuring to 8 mm with surrounding inflammatory change. No free air or intra-abdominal fluid collections. No evidence of bowel wall thickening obstruction. Vascular/Lymphatic: No significant vascular findings are present. No enlarged abdominal or pelvic lymph nodes. Reproductive: Prostate is unremarkable. Other: No abdominal wall hernia or abnormality. No abdominopelvic ascites. Musculoskeletal: No acute  or significant osseous findings. IMPRESSION: 1. Findings compatible with acute uncomplicated appendicitis. 2. Bilateral punctate nonobstructing renal stones. Electronically Signed   By: Allegra Lai M.D.   On: 06/26/2021 09:21   ? ?Procedures ?Procedures  ? ? ?Medications Ordered in ED ?Medications  ?HYDROmorphone (DILAUDID) injection 1 mg (has no administration in time range)  ?sodium chloride 0.9 % bolus 1,000 mL (0 mLs Intravenous Stopped 06/26/21 1011)  ?ondansetron [facility]) injection 4 mg (4 mg Intravenous Given 06/26/21 0824)  ?HYDROmorphone (DILAUDID) injection 1 mg (1 mg Intravenous Given 06/26/21 0827)  ?iohexol (OMNIPAQUE) 300 MG/ML solution 100 mL (100 mLs Intravenous Contrast Given 06/26/21 0858)  ? ? ?ED Course/ Medical Decision Making/ A&P ?  ?                        ?Medical Decision Making ?Amount and/or Complexity of Data Reviewed ?Labs: ordered. ?Radiology: ordered. ? ?Risk ?Prescription drug management. ?Decision regarding hospitalization. ? ? ? ? ?This patient presents to the ED for concern of abdominal pain, this involves an extensive number  of treatment options, and is a complaint that carries with it a high risk of complications and morbidity.  The differential diagnosis includes appendicitis, constipation, ulcers ? ? ?Co morbidities that complicate the patient evaluation ? ?None ? ? ?Additional history obtained: ? ?Additional history obtained from patient ?External records from outside source obtained and reviewed including hospital records ? ? ?Lab Tests: ? ?I Ordered, and personally interpreted labs.  The pertinent results include: CBC and chemistries that showed a white count elevated at 12.6, chemistries unremarkable ? ? ?Imaging Studies ordered: ? ?I ordered imaging studies including CT abdomen ?I independently visualized and interpreted imaging which showed uncomplicated appendicitis ?I agree with the radiologist interpretation ? ? ?Cardiac Monitoring: / EKG: ? ?The patient was maintained  on a cardiac monitor.  I personally viewed and interpreted the cardiac monitored which showed an underlying rhythm of: Normal sinus rhythm ? ? ?Consultations Obtained: ? ?I requested consultation with the gener

## 2021-06-26 NOTE — Op Note (Signed)
[facility] ? ?Date of Surgery: 06/26/2021  ?Admit Date: 06/26/2021  ? ?Performing Service: General  ?Surgeon(s) and Role: ?   * Jezlyn Westerfield, Leatrice Jewels, MD - Primary  ? ?Pre-operative Diagnosis: Acute Appendicitis ? ?Post-operative Diagnosis: Acute Appendicitis ? ?Procedure Performed: Laparoscopic Appendectomy  ? ?Surgeon: Leatrice Jewels. Henreitta Leber, MD  ? ?Assistant: No qualified resident was available.  ? ?Anesthesia: General  ? ?Findings:  ?The appendix was found to be inflamed. There were not signs of necrosis. There was not perforation. There was not abscess formation.  ? ?Estimated Blood Loss: Minimal  ? ?Specimens:  ?ID Type Source Tests Collected by Time Destination  ?1 : Appendix Tissue PATH Appendix SURGICAL PATHOLOGY Lucretia Roers, MD 06/26/2021 1410   ?  ? ?Complications: None; patient tolerated the procedure well.  ? ?Disposition: PACU - hemodynamically stable.  ? ?Condition: stable  ? ?Indications: The patient presented with a 1 day history of right-sided abdominal pain. A CT revealed findings consistent with acute appendicitis.  ? ?Procedure Details  ?Prior to the procedure, the risks, benefits, complications, treatment options, and expected outcomes were discussed with the patient and/or family, including but not limited to the risk of bleeding, infection, finding of a normal appendix, and the need for conversion to an open procedure. There was concurrence with the proposed plan and informed consent was obtained. The patient was taken to the operating room, identified as Lily Lovings and the procedure verified as Laproscopic Appendectomy.  ? ? ?The patient was placed in the supine position and general anesthesia was induced, along with placement of orogastric tube, SCD's, and a Foley catheter. The abdomen was prepped and draped in a sterile fashion. The abdomen was entered with Veress technique in the infraumbilical incision. Intraperitoneal placement was confirmed with saline drop, low entry  pressures, and easy insufflation. A 11 mm optiview trocar was placed under direct visualization with a 0 degree scope. The 10 mm 0 degree scope was placed in the abdomen and no evidence of injury was identified. A 12 mm port was placed in the left lower quadrant of the abdomen after skin incision with trocar placement under direct vision. A careful evaluation of the entire abdomen was carried out. An additional 5 mm port was placed in the suprapubic area under direct vision.  The patient was placed in Trendelenburg and left lateral decubitus position. The small intestines were retracted in the cephalad and left lateral direction away from the pelvis and right lower quadrant. The patient was found to have an dilated inflamed appendix. There was not evidence of perforation.  ? ?The appendix was carefully dissected. A window was made in the mesoappendix at the base of the appendix. The appendix was divided at its base using standard endo-GIA stapler. Minimal appendiceal stump was left in place. The mesoappendix was taken with the harmonic energy device. The appendix was placed within an Endocatch specimen bag. There was no evidence of bleeding, leakage, or complication after division of the appendix. ? ?Any remaining blood or pus was suctioned out from the abdomen, hemostasis was confirmed. The endocatch bag was removed via the 12 mm port, then the abdomen desufflated. The appendix was passed off the field as a specimen.  ? ?The the 12 mm and 10 mm port sites were closed with a 0 Vicryl suture. The trocar site skin wounds were closed using subcuticular 4-0 Monocryl suture and dermabond. The patient was then awakened from general anesthesia, extubated, and taken to PACU for recovery.  ? ?  Instrument, sponge, and needle counts were correct at the conclusion of the case.  ? ?Algis Greenhouse, MD ?[facility] ?[address] ?[place], [place] [address] ?[phone]([id number]) ? ? ? ? ?

## 2021-06-28 LAB — SURGICAL PATHOLOGY

## 2021-07-02 ENCOUNTER — Other Ambulatory Visit (INDEPENDENT_AMBULATORY_CARE_PROVIDER_SITE_OTHER): Payer: Medicaid Other | Admitting: General Surgery

## 2021-07-02 ENCOUNTER — Encounter (HOSPITAL_COMMUNITY): Payer: Self-pay | Admitting: General Surgery

## 2021-07-02 DIAGNOSIS — Z09 Encounter for follow-up examination after completed treatment for conditions other than malignant neoplasm: Secondary | ICD-10-CM

## 2021-07-02 MED ORDER — OXYCODONE HCL 5 MG PO TABS: 5.0000 mg | ORAL_TABLET | ORAL | 0 refills | Status: AC | PRN

## 2021-07-02 NOTE — Progress Notes (Signed)
Refilled pain medication

## 2021-07-10 ENCOUNTER — Ambulatory Visit (INDEPENDENT_AMBULATORY_CARE_PROVIDER_SITE_OTHER): Payer: Self-pay | Admitting: General Surgery

## 2021-07-10 DIAGNOSIS — K353 Acute appendicitis with localized peritonitis, without perforation or gangrene: Secondary | ICD-10-CM

## 2021-07-10 NOTE — Progress Notes (Addendum)
[facility] ? ?I am calling the patient for post operative evaluation. This is not a billable encounter as it is under the [place] charges for the surgery.  The patient had a laparoscopic appendectomy on 06/26/21.  Patient says he is doing good and incisions healing and having good pain control. He is eating and having Bms. He did have some constipation at first but now that is better he did have some bleeding with that first BM. No more bleeding.  He is back to work. ? ?Pathology: ?A. APPENDIX, APPENDECTOMY:  ?-  Acute suppurative appendicitis with serositis and periappendiceal  ?inflammation  ? ?Will see the patient PRN.  ? ?Curlene Labrum, MD ?[facility] ?[address] [place], [place] [address] ?[phone] (office) ? ?

## 2021-07-11 ENCOUNTER — Emergency Department (HOSPITAL_COMMUNITY): Payer: Medicaid Other

## 2021-07-11 ENCOUNTER — Telehealth: Payer: Self-pay | Admitting: *Deleted

## 2021-07-11 ENCOUNTER — Emergency Department (HOSPITAL_COMMUNITY)
Admission: EM | Admit: 2021-07-11 | Discharge: 2021-07-11 | Disposition: A | Discharge status: Home / Self Care | Payer: Medicaid Other | Attending: Emergency Medicine | Admitting: Emergency Medicine

## 2021-07-11 ENCOUNTER — Encounter (HOSPITAL_COMMUNITY): Payer: Self-pay | Admitting: Emergency Medicine

## 2021-07-11 DIAGNOSIS — K921 Melena: Secondary | ICD-10-CM | POA: Insufficient documentation

## 2021-07-11 DIAGNOSIS — R1084 Generalized abdominal pain: Secondary | ICD-10-CM | POA: Insufficient documentation

## 2021-07-11 DIAGNOSIS — R000 Tachycardia, unspecified: Secondary | ICD-10-CM | POA: Diagnosis not present

## 2021-07-11 DIAGNOSIS — E860 Dehydration: Secondary | ICD-10-CM | POA: Diagnosis not present

## 2021-07-11 LAB — URINALYSIS, ROUTINE W REFLEX MICROSCOPIC
Bilirubin Urine: NEGATIVE
Glucose, UA: NEGATIVE mg/dL
Hgb urine dipstick: NEGATIVE
Ketones, ur: 5 mg/dL — AB
Leukocytes,Ua: NEGATIVE
Nitrite: NEGATIVE
Protein, ur: NEGATIVE mg/dL
Specific Gravity, Urine: 1.042 — ABNORMAL HIGH (ref 1.005–1.030)
pH: 7 (ref 5.0–8.0)

## 2021-07-11 LAB — COMPREHENSIVE METABOLIC PANEL
ALT: 31 U/L (ref 0–44)
AST: 24 U/L (ref 15–41)
Albumin: 4.9 g/dL (ref 3.5–5.0)
Alkaline Phosphatase: 72 U/L (ref 38–126)
Anion gap: 9 (ref 5–15)
BUN: 14 mg/dL (ref 6–20)
CO2: 27 mmol/L (ref 22–32)
Calcium: 10 mg/dL (ref 8.9–10.3)
Chloride: 102 mmol/L (ref 98–111)
Creatinine, Ser: 0.78 mg/dL (ref 0.61–1.24)
GFR, Estimated: >60 mL/min (ref >60)
Glucose, Bld: 96 mg/dL (ref 70–99)
Potassium: 3.5 mmol/L (ref 3.5–5.1)
Sodium: 138 mmol/L (ref 135–145)
Total Bilirubin: 0.9 mg/dL (ref 0.3–1.2)
Total Protein: 8.8 g/dL — ABNORMAL HIGH (ref 6.5–8.1)

## 2021-07-11 LAB — CBC WITH DIFFERENTIAL/PLATELET
Abs Immature Granulocytes: 0.03 10*3/uL (ref 0.00–0.07)
Basophils Absolute: 0.1 10*3/uL (ref 0.0–0.1)
Basophils Relative: 1 %
Eosinophils Absolute: 0 10*3/uL (ref 0.0–0.5)
Eosinophils Relative: 0 %
HCT: 43.5 % (ref 39.0–52.0)
Hemoglobin: 14 g/dL (ref 13.0–17.0)
Immature Granulocytes: 0 %
Lymphocytes Relative: 20 %
Lymphs Abs: 1.8 10*3/uL (ref 0.7–4.0)
MCH: 30.1 pg (ref 26.0–34.0)
MCHC: 32.2 g/dL (ref 30.0–36.0)
MCV: 93.5 fL (ref 80.0–100.0)
Monocytes Absolute: 0.7 10*3/uL (ref 0.1–1.0)
Monocytes Relative: 8 %
Neutro Abs: 6.6 10*3/uL (ref 1.7–7.7)
Neutrophils Relative %: 71 %
Platelets: 468 10*3/uL — ABNORMAL HIGH (ref 150–400)
RBC: 4.65 MIL/uL (ref 4.22–5.81)
RDW: 12.9 % (ref 11.5–15.5)
WBC: 9.3 10*3/uL (ref 4.0–10.5)
nRBC: 0 % (ref 0.0–0.2)

## 2021-07-11 LAB — LIPASE, BLOOD: Lipase: 22 U/L (ref 11–51)

## 2021-07-11 MED ORDER — IOHEXOL 300 MG/ML  SOLN
100.0000 mL | Freq: Once | INTRAMUSCULAR | Status: AC | PRN
  Administered 2021-07-11: 100 mL via INTRAVENOUS

## 2021-07-11 MED ORDER — LACTATED RINGERS IV BOLUS
1000.0000 mL | Freq: Once | INTRAVENOUS | Status: AC
  Administered 2021-07-11: 1000 mL via INTRAVENOUS

## 2021-07-11 NOTE — ED Notes (Signed)
Pt refused rectal exam and rectal occult. ?

## 2021-07-11 NOTE — Telephone Encounter (Signed)
Received call from patient [phone]~ telephone.  ? ?Surgical Date: 06/26/2021 ?Procedure: Lap Appy ? ?Patient reports that he has increased abdominal pain, bright red blood noted in commode after BM, and N/V that began during night of 07/10/2021. States that he is unable to keep fluids down at this time.  ? ?Advised to return to ER for evaluation. Patient verbalized understanding.  ? ?Dr. Henreitta Leber and on call provider, Dr. Robyne Peers made aware.  ?

## 2021-07-11 NOTE — Discharge Instructions (Addendum)
Today your testing shows that you are dehydrated. ?Your CT scan was reassuring as was the rest your blood work. ?Please make sure you are drinking plenty of water and staying well-hydrated. ?Soda such as [employer] can actually make you dehydrated due to the caffeine they can obtain causing you to urinate more. ?You develop fevers, worsening abdominal pain, have additional blood in your stool or have any new or concerning symptoms please seek additional medical care and evaluation. ?Please take prilosec (over the counter) or protonix if you have some left over.  ?

## 2021-07-11 NOTE — ED Triage Notes (Signed)
Pt had appendix removed 2 weeks ago. Pt had 2 episodes of bloody BM, last BM was negative for blood. Was having lower abd pain but none at this time.  ?

## 2021-07-11 NOTE — ED Provider Notes (Signed)
?[facility] ?Provider Note ? ? ?CSN: [id number] ?Arrival date & time: 07/11/21  1427 ? ?  ? ?History ? ?Chief Complaint  ?Patient presents with  ? Blood In Stools  ? ? ?James Boyle is a 31 y.o. male with a past medical history of laparoscopic appendectomy on 06/26/2021, who presents today for evaluation of nausea, vomiting, and blood in his bowel movement. ? ?He has had 2 episodes of bloody bowel movements since yesterday. ?He states that he started feeling weird yesterday after [facility].  He doesn't know if it was laced with anything.  ? ?No trauma.  ? ?HPI ? ?  ? ?Home Medications ?Prior to Admission medications   ?Medication Sig Start Date End Date Taking? Authorizing Provider  ?ondansetron (ZOFRAN) 4 MG tablet Take 1 tablet (4 mg total) by mouth every 8 (eight) hours as needed. 06/26/21 06/26/22  Lucretia RoersBridges, Lindsay C, MD  ?oxyCODONE (ROXICODONE) 5 MG immediate release tablet Take 1 tablet (5 mg total) by mouth every 4 (four) hours as needed for severe pain or breakthrough pain. 07/02/21 07/02/22  Franky MachoJenkins, Mark, MD  ?pantoprazole (PROTONIX) 40 MG tablet Take 1 tablet (40 mg total) by mouth daily. 05/16/21   Pauline Ausriplett, Tammy, PA-C  ?   ? ?Allergies    ?Motrin [ibuprofen] and Tramadol   ? ?Review of Systems   ?Review of Systems ? ?Physical Exam ?Updated Vital Signs ?BP 113/80   Pulse 89   Temp 98.4 ?F (36.9 ?C) (Oral)   Resp 16   Ht 5\' 2"  (1.575 m)   Wt 72.6 kg   SpO2 99%   BMI 29.27 kg/m?  ?Physical Exam ?Vitals and nursing note reviewed.  ?Constitutional:   ?   General: He is not in acute distress. ?   Appearance: He is not ill-appearing or diaphoretic.  ?HENT:  ?   Head: Normocephalic and atraumatic.  ?Eyes:  ?   General: No scleral icterus.    ?   Right eye: No discharge.     ?   Left eye: No discharge.  ?   Conjunctiva/sclera: Conjunctivae normal.  ?Cardiovascular:  ?   Rate and Rhythm: Regular rhythm. Tachycardia present.  ?Pulmonary:  ?   Effort:  Pulmonary effort is normal. No respiratory distress.  ?   Breath sounds: No stridor.  ?Abdominal:  ?   General: There is no distension.  ?   Tenderness: There is abdominal tenderness (Suprapubic and epigastric). There is no guarding or rebound.  ?Genitourinary: ?   Comments: Patient refused rectal exam ?Musculoskeletal:     ?   General: No deformity.  ?   Cervical back: Normal range of motion and neck supple.  ?   Comments: No obvious acute injury  ?Skin: ?   General: Skin is warm and dry.  ?Neurological:  ?   General: No focal deficit present.  ?   Mental Status: He is alert and oriented to person, place, and time.  ?   Motor: No abnormal muscle tone.  ?   Comments: Awake and alert, answers all questions appropriately.  Speech is not slurred.  ?Psychiatric:     ?   Mood and Affect: Mood normal.     ?   Behavior: Behavior normal.  ? ? ?ED Results / Procedures / Treatments   ?Labs ?(all labs ordered are listed, but only abnormal results are displayed) ?Labs Reviewed  ?COMPREHENSIVE METABOLIC PANEL - Abnormal; Notable for the following  components:  ?    Result Value  ? Total Protein 8.8 (*)   ? All other components within normal limits  ?CBC WITH DIFFERENTIAL/PLATELET - Abnormal; Notable for the following components:  ? Platelets 468 (*)   ? All other components within normal limits  ?URINALYSIS, ROUTINE W REFLEX MICROSCOPIC - Abnormal; Notable for the following components:  ? Specific Gravity, Urine 1.042 (*)   ? Ketones, ur 5 (*)   ? All other components within normal limits  ?LIPASE, BLOOD  ? ? ?EKG ?None ? ?Radiology ?CT ABDOMEN PELVIS W CONTRAST ? ?Result Date: 07/11/2021 ?CLINICAL DATA:  Abdominal pain, acute, nonlocalized Reported blood in BM, refused occult testing, Appendectomy this month. N/V. EXAM: CT ABDOMEN AND PELVIS WITH CONTRAST TECHNIQUE: Multidetector CT imaging of the abdomen and pelvis was performed using the standard protocol following bolus administration of intravenous contrast. RADIATION DOSE  REDUCTION: This exam was performed according to the departmental dose-optimization program which includes automated exposure control, adjustment of the mA and/or kV according to patient size and/or use of iterative reconstruction technique. CONTRAST:  OMNIPAQUE IOHEXOL 300 MG/ML  SOLN COMPARISON:  CT abdomen pelvis 06/26/2021 FINDINGS: Lower chest: Bibasilar hypoventilatory changes. No acute abnormality. Hepatobiliary: No focal liver abnormality is seen. The gallbladder is unremarkable. Pancreas: Unremarkable. No pancreatic ductal dilatation or surrounding inflammatory changes. Spleen: Normal in size without focal abnormality. Adrenals/Urinary Tract: Adrenal glands are unremarkable. No hydronephrosis. Unchanged punctate nonobstructive renal stones bilaterally. Bladder is unremarkable. Stomach/Bowel: The stomach is within normal limits. There is no evidence of bowel obstruction.Postsurgical changes of recent appendectomy. Vascular/Lymphatic: No significant vascular findings are present. No enlarged abdominal or pelvic lymph nodes. Reproductive: Unremarkable. Other: No abdominal wall hernia or abnormality. No abdominopelvic ascites. No focal fluid collection. Musculoskeletal: No acute osseous abnormality. No suspicious lytic or blastic lesions. Limbus vertebrae of the anterior superior aspect of L5. There are multiple Schmorl's nodes noted. IMPRESSION: No acute abdominopelvic abnormality. Postsurgical changes of recent appendectomy. Unchanged bilateral punctate nonobstructive renal stones. Electronically Signed   By: Caprice Renshaw M.D.   On: 07/11/2021 17:06   ? ?Procedures ?Procedures  ? ? ?Medications Ordered in ED ?Medications  ?lactated ringers bolus 1,000 mL (0 mLs Intravenous Stopped 07/11/21 1659)  ?iohexol (OMNIPAQUE) 300 MG/ML solution 100 mL (100 mLs Intravenous Contrast Given 07/11/21 1649)  ? ? ?ED Course/ Medical Decision Making/ A&P ?Clinical Course as of 07/11/21 2334  ?Wed Jul 11, 2021  ?[id number] Patient  refused both external and DRE rectal exam. ?We discussed that this limits my ability to evaluate him.  Potentially life-threatening conditions.  He states his understanding and continues to refuse. [EH]  ?1710 CT ABDOMEN PELVIS W CONTRAST ?No acute changes or cause for symptoms found.   [EH]  ?1711 Specific Gravity, Urine(!): 1.042 ?Consistent with dehydration. [EH]  ?1715 BUN: 14 ?BUN not elevated, goes against upper gi bleed [EH]  ?  ?Clinical Course User Index ?[EH] Cristina Gong, PA-C  ? ?                        ?Medical Decision Making ?ELCHANAN BOB is a 31 year old man who presents today for evaluation of concern for blood in his stools.  Patient refused rectal exam or occult testing.   ?He does have a history of upper GI bleed due to NSAID, denies frequent NSAID use. ?Had an appendectomy on 4/4 and had been doing well until yesterday when his symptoms started. ?Labs are obtained  and reviewed, please see below. ?CT abdomen pelvis is obtained without cause for symptoms found.  He is not anticoagulated.  His BUN is not significantly elevated which goes against a significant upper GI bleed.  His urine is consistent with dehydration. ?We discussed that with his refusal for occult testing this limits my ability to evaluate him today and he states his understanding and continues to refuse. ? ?He was able to p.o. challenge in the emergency room without difficulty. ?With his history of upper GI bleed due to NSAIDs did recommend he take Protonix or Prilosec. ? ?He was treated with one liter of IVF while in the ER.  ? ?Return precautions were discussed with patient who states their understanding.  At the time of discharge patient denied any unaddressed complaints or concerns.  Patient is agreeable for discharge home. ? ?Note: Portions of this report may have been transcribed using voice recognition software. Every effort was made to ensure accuracy; however, inadvertent computerized transcription errors may be  present ? ? ? ? ? ? ?Amount and/or Complexity of Data Reviewed ?External Data Reviewed: labs, radiology and notes. ?   Details: Visit follow-up with surgery, appendectomy ?Labs: ordered. Decision-making details

## 2021-07-11 NOTE — ED Notes (Signed)
Pt was given water and peanut butter crackers  ?

## 2021-07-12 ENCOUNTER — Telehealth: Payer: Self-pay | Admitting: *Deleted

## 2021-07-12 NOTE — Telephone Encounter (Signed)
Call placed to patient to follow up after ER visit on 07/11/2021. ? ?CT ABD and labs from ER visit noted negative. Dr Henreitta Leber reviewed and recommends that patient most likely has constipation and hemorrhoids that can cause bleeding.  ? ?Call placed to patient. LMTRC. ?

## 2021-07-16 NOTE — Telephone Encounter (Signed)
Multiple calls placed to patient with no answer and no return call.   Message to be closed.  

## 2021-10-29 ENCOUNTER — Encounter (HOSPITAL_COMMUNITY): Payer: Self-pay | Admitting: *Deleted

## 2021-10-29 ENCOUNTER — Emergency Department (HOSPITAL_COMMUNITY)
Admission: EM | Admit: 2021-10-29 | Discharge: 2021-10-29 | Disposition: A | Discharge status: Home / Self Care | Payer: Medicaid Other | Attending: Emergency Medicine | Admitting: Emergency Medicine

## 2021-10-29 ENCOUNTER — Other Ambulatory Visit: Payer: Self-pay

## 2021-10-29 DIAGNOSIS — R519 Headache, unspecified: Secondary | ICD-10-CM | POA: Diagnosis present

## 2021-10-29 DIAGNOSIS — G43809 Other migraine, not intractable, without status migrainosus: Secondary | ICD-10-CM | POA: Insufficient documentation

## 2021-10-29 LAB — CBC WITH DIFFERENTIAL/PLATELET
Abs Immature Granulocytes: 0 10*3/uL (ref 0.00–0.07)
Basophils Absolute: 0 10*3/uL (ref 0.0–0.1)
Basophils Relative: 1 %
Eosinophils Absolute: 0 10*3/uL (ref 0.0–0.5)
Eosinophils Relative: 1 %
HCT: 40.1 % (ref 39.0–52.0)
Hemoglobin: 13.4 g/dL (ref 13.0–17.0)
Immature Granulocytes: 0 %
Lymphocytes Relative: 34 %
Lymphs Abs: 2.2 10*3/uL (ref 0.7–4.0)
MCH: 30.9 pg (ref 26.0–34.0)
MCHC: 33.4 g/dL (ref 30.0–36.0)
MCV: 92.4 fL (ref 80.0–100.0)
Monocytes Absolute: 0.4 10*3/uL (ref 0.1–1.0)
Monocytes Relative: 6 %
Neutro Abs: 3.8 10*3/uL (ref 1.7–7.7)
Neutrophils Relative %: 58 %
Platelets: 365 10*3/uL (ref 150–400)
RBC: 4.34 MIL/uL (ref 4.22–5.81)
RDW: 12.3 % (ref 11.5–15.5)
WBC: 6.4 10*3/uL (ref 4.0–10.5)
nRBC: 0 % (ref 0.0–0.2)

## 2021-10-29 LAB — COMPREHENSIVE METABOLIC PANEL
ALT: 17 U/L (ref 0–44)
AST: 17 U/L (ref 15–41)
Albumin: 4.1 g/dL (ref 3.5–5.0)
Alkaline Phosphatase: 53 U/L (ref 38–126)
Anion gap: 6 (ref 5–15)
BUN: 9 mg/dL (ref 6–20)
CO2: 26 mmol/L (ref 22–32)
Calcium: 9.1 mg/dL (ref 8.9–10.3)
Chloride: 104 mmol/L (ref 98–111)
Creatinine, Ser: 0.75 mg/dL (ref 0.61–1.24)
GFR, Estimated: >60 mL/min (ref >60)
Glucose, Bld: 114 mg/dL — ABNORMAL HIGH (ref 70–99)
Potassium: 3.4 mmol/L — ABNORMAL LOW (ref 3.5–5.1)
Sodium: 136 mmol/L (ref 135–145)
Total Bilirubin: 0.4 mg/dL (ref 0.3–1.2)
Total Protein: 7.7 g/dL (ref 6.5–8.1)

## 2021-10-29 LAB — URINALYSIS, ROUTINE W REFLEX MICROSCOPIC
Bilirubin Urine: NEGATIVE
Glucose, UA: NEGATIVE mg/dL
Hgb urine dipstick: NEGATIVE
Ketones, ur: NEGATIVE mg/dL
Leukocytes,Ua: NEGATIVE
Nitrite: NEGATIVE
Protein, ur: NEGATIVE mg/dL
Specific Gravity, Urine: 1.021 (ref 1.005–1.030)
pH: 6 (ref 5.0–8.0)

## 2021-10-29 LAB — LIPASE, BLOOD: Lipase: 26 U/L (ref 11–51)

## 2021-10-29 MED ORDER — LACTATED RINGERS IV BOLUS
1000.0000 mL | Freq: Once | INTRAVENOUS | Status: AC
  Administered 2021-10-29: 1000 mL via INTRAVENOUS

## 2021-10-29 MED ORDER — METOCLOPRAMIDE HCL 5 MG/ML IJ SOLN
5.0000 mg | Freq: Once | INTRAMUSCULAR | Status: AC
  Administered 2021-10-29: 5 mg via INTRAVENOUS
  Filled 2021-10-29: qty 2

## 2021-10-29 MED ORDER — SUCRALFATE 1 GM/10ML PO SUSP
1.0000 g | Freq: Once | ORAL | Status: AC
  Administered 2021-10-29: 1 g via ORAL
  Filled 2021-10-29: qty 10

## 2021-10-29 MED ORDER — MAGNESIUM SULFATE IN D5W 1-5 GM/100ML-% IV SOLN
1.0000 g | Freq: Once | INTRAVENOUS | Status: AC
Start: 2021-10-29 — End: 2021-10-29
  Administered 2021-10-29: 1 g via INTRAVENOUS
  Filled 2021-10-29: qty 100

## 2021-10-29 MED ORDER — ACETAMINOPHEN 325 MG PO TABS
650.0000 mg | ORAL_TABLET | Freq: Once | ORAL | Status: AC
  Administered 2021-10-29: 650 mg via ORAL
  Filled 2021-10-29: qty 2

## 2021-10-29 MED ORDER — SUCRALFATE 1 G PO TABS: 1.0000 g | ORAL_TABLET | Freq: Three times a day (TID) | ORAL | 0 refills | Status: AC

## 2021-10-29 MED ORDER — METOCLOPRAMIDE HCL 5 MG PO TABS: 5.0000 mg | ORAL_TABLET | Freq: Three times a day (TID) | ORAL | 0 refills | Status: AC | PRN

## 2021-10-29 MED ORDER — LIDOCAINE 5 % EX PTCH
1.0000 | MEDICATED_PATCH | CUTANEOUS | Status: DC
  Administered 2021-10-29: 1 via TRANSDERMAL
  Filled 2021-10-29: qty 1

## 2021-10-29 MED ORDER — PANTOPRAZOLE SODIUM 40 MG PO TBEC: 40.0000 mg | DELAYED_RELEASE_TABLET | Freq: Every day | ORAL | 0 refills | Status: AC

## 2021-10-29 MED ORDER — DIPHENHYDRAMINE HCL 50 MG/ML IJ SOLN
25.0000 mg | Freq: Once | INTRAMUSCULAR | Status: AC
  Administered 2021-10-29: 25 mg via INTRAVENOUS
  Filled 2021-10-29: qty 1

## 2021-10-29 NOTE — ED Provider Notes (Signed)
[facility] EMERGENCY DEPARTMENT Provider Note   CSN: [id number] Arrival date & time: 10/29/21  0744     History  Chief Complaint  Patient presents with   Migraine    James Boyle is a 31 y.o. male.  Patient as above with significant medical history as below, including chronic ha, hematemesis, mallory weiss tear s/p endoscopy, appendectomy who presents to the ED with complaint of ha. Symptoms ongoing last 4 days, feels similar to prior ha, a/w pressure/throbbing sensation. Mild nausea with 2-3 episodes of emesis. No sig abd pain. No change to bowel/bladder fxn, no fevers or chills, no ivdu, no neck pain, no vision changes, no photo/phonophobia. Taking motrin and bc powder without much relief. He had 1-2 episodes of blood tinged emesis, last episode was yestd.   No etoh, no tobacco    Past Medical History:  Diagnosis Date   Chronic headaches    Chronic knee pain    Hematemesis    Mallory-Weiss tear 2015    Past Surgical History:  Procedure Laterality Date   ESOPHAGOGASTRODUODENOSCOPY N/A 06/25/2013   Procedure: ESOPHAGOGASTRODUODENOSCOPY (EGD);  Surgeon: West Bali, MD;  Location: [facility] ENDO SUITE;  Service: Endoscopy;  Laterality: N/A;   LAPAROSCOPIC APPENDECTOMY N/A 06/26/2021   Procedure: APPENDECTOMY LAPAROSCOPIC;  Surgeon: Lucretia Roers, MD;  Location: [facility] ORS;  Service: General;  Laterality: N/A;   None       The history is provided by the patient. No language interpreter was used.  Migraine Associated symptoms include headaches. Pertinent negatives include no chest pain, no abdominal pain and no shortness of breath.       Home Medications Prior to Admission medications   Medication Sig Start Date End Date Taking? Authorizing Provider  metoCLOPramide (REGLAN) 5 MG tablet Take 1 tablet (5 mg total) by mouth every 8 (eight) hours as needed for nausea. 10/29/21  Yes Tanda Rockers A, DO  pantoprazole (PROTONIX) 40 MG tablet Take 1 tablet (40 mg total) by mouth daily  for 14 days. 10/29/21 11/12/21 Yes Tanda Rockers A, DO  sucralfate (CARAFATE) 1 g tablet Take 1 tablet (1 g total) by mouth 4 (four) times daily -  with meals and at bedtime for 7 days. 10/29/21 11/05/21 Yes Tanda Rockers A, DO  ondansetron (ZOFRAN) 4 MG tablet Take 1 tablet (4 mg total) by mouth every 8 (eight) hours as needed. 06/26/21 06/26/22  Lucretia Roers, MD  oxyCODONE (ROXICODONE) 5 MG immediate release tablet Take 1 tablet (5 mg total) by mouth every 4 (four) hours as needed for severe pain or breakthrough pain. 07/02/21 07/02/22  Franky Macho, MD  pantoprazole (PROTONIX) 40 MG tablet Take 1 tablet (40 mg total) by mouth daily. 05/16/21   Triplett, Tammy, PA-C      Allergies    Motrin [ibuprofen] and Tramadol    Review of Systems   Review of Systems  Constitutional:  Negative for chills and fever.  HENT:  Negative for facial swelling and trouble swallowing.   Eyes:  Negative for photophobia and visual disturbance.  Respiratory:  Negative for cough and shortness of breath.   Cardiovascular:  Negative for chest pain and palpitations.  Gastrointestinal:  Positive for nausea and vomiting. Negative for abdominal pain.  Endocrine: Negative for polydipsia and polyuria.  Genitourinary:  Negative for difficulty urinating and hematuria.  Musculoskeletal:  Negative for gait problem and joint swelling.  Skin:  Negative for pallor and rash.  Neurological:  Positive for headaches. Negative for syncope.  Psychiatric/Behavioral:  Negative for agitation and confusion.     Physical Exam Updated Vital Signs BP 101/75   Pulse 70   Temp 97.8 F (36.6 C)   Resp 17   Ht 5\' 2"  (1.575 m)   Wt 72.6 kg   SpO2 98%   BMI 29.26 kg/m  Physical Exam Vitals and nursing note reviewed.  Constitutional:      General: He is not in acute distress.    Appearance: Normal appearance. He is well-developed. He is not ill-appearing or diaphoretic.  HENT:     Head: Normocephalic and atraumatic. No right periorbital  erythema or left periorbital erythema.     Right Ear: External ear normal.     Left Ear: External ear normal.     Mouth/Throat:     Mouth: Mucous membranes are moist.  Eyes:     General: No scleral icterus.    Extraocular Movements: Extraocular movements intact.     Pupils: Pupils are equal, round, and reactive to light.  Cardiovascular:     Rate and Rhythm: Normal rate and regular rhythm.     Pulses: Normal pulses.     Heart sounds: Normal heart sounds.  Pulmonary:     Effort: Pulmonary effort is normal. No tachypnea or respiratory distress.     Breath sounds: Normal breath sounds.  Abdominal:     General: Abdomen is flat.     Palpations: Abdomen is soft.     Tenderness: There is no abdominal tenderness. There is no guarding or rebound. Negative signs include Murphy's sign and McBurney's sign.  Musculoskeletal:        General: Normal range of motion.     Cervical back: Full passive range of motion without pain and normal range of motion.     Right lower leg: No edema.     Left lower leg: No edema.  Skin:    General: Skin is warm and dry.     Capillary Refill: Capillary refill takes less than 2 seconds.  Neurological:     Mental Status: He is alert and oriented to person, place, and time.     GCS: GCS eye subscore is 4. GCS verbal subscore is 5. GCS motor subscore is 6.     Cranial Nerves: Cranial nerves 2-12 are intact. No dysarthria or facial asymmetry.     Sensory: Sensation is intact. No sensory deficit.     Motor: Motor function is intact. No tremor.     Coordination: Coordination is intact.     Gait: Gait is intact.  Psychiatric:        Mood and Affect: Mood normal.        Behavior: Behavior normal.     ED Results / Procedures / Treatments   Labs (all labs ordered are listed, but only abnormal results are displayed) Labs Reviewed  COMPREHENSIVE METABOLIC PANEL - Abnormal; Notable for the following components:      Result Value   Potassium 3.4 (*)    Glucose, Bld  114 (*)    All other components within normal limits  CBC WITH DIFFERENTIAL/PLATELET  LIPASE, BLOOD  URINALYSIS, ROUTINE W REFLEX MICROSCOPIC    EKG None  Radiology No results found.  Procedures Procedures    Medications Ordered in ED Medications  lidocaine (LIDODERM) 5 % 1 patch (1 patch Transdermal Patch Applied 10/29/21 0930)  magnesium sulfate IVPB 1 g 100 mL (0 g Intravenous Stopped 10/29/21 1212)  metoCLOPramide (REGLAN) injection 5 mg (5 mg Intravenous Given 10/29/21 0914)  diphenhydrAMINE (  BENADRYL) injection 25 mg (25 mg Intravenous Given 10/29/21 0914)  acetaminophen (TYLENOL) tablet 650 mg (650 mg Oral Given 10/29/21 0915)  sucralfate (CARAFATE) 1 GM/10ML suspension 1 g (1 g Oral Given 10/29/21 0915)  lactated ringers bolus 1,000 mL (0 mLs Intravenous Stopped 10/29/21 1212)    ED Course/ Medical Decision Making/ A&P                           Medical Decision Making Amount and/or Complexity of Data Reviewed Labs: ordered.  Risk OTC drugs. Prescription drug management.    CC: ha, emesis  This patient presents to the Emergency Department for the above complaint. This involves an extensive number of treatment options and is a complaint that carries with it a high risk of complications and morbidity. Vital signs were reviewed. Serious etiologies considered.  Differential diagnosis includes but is not exclusive to subarachnoid hemorrhage, meningitis, encephalitis, previous head trauma, cavernous venous thrombosis, muscle tension headache, glaucoma, temporal arteritis, migraine or migraine equivalent, etc.   Record review:  Previous records obtained and reviewed prior ed visits, prior procedures, prior labs/imaging   Additional history obtained from n/a  Medical and surgical history as noted above.   Work up as above, notable for:  Labs & imaging results that were available during my care of the patient were visualized by me and considered in my medical decision  making.  Physical exam as above.    Labs reviewed and are stable..  Management: Migraine cocktail Antacid ivf  ED Course:     Reassessment:  Symptoms resolved, tolerating PO w/o difficulty.   Admission was considered.   He is feeling back to his baseline.  Headache is resolved entirely.  No nausea, no epigastric abdominal discomfort.  Abdomen is soft, nontender, nonperitoneal exam.  The trace amount of hematemesis may be secondary to possible gastritis due to his use of BC powder and NSAIDs.  Advised patient refrain from use of NSAIDs and BC powder.  We will give PPI.  Have him follow-up with his gastroenterologist.  No EtOH. No hx varices, he does have hx mallory weiss tear.  Follow-up with GI.  >>>Avoid NSAIDS   Patient presents with headache. Based on the patient's history and physical there is very low clinical suspicion for significant intracranial pathology. The headache was not sudden onset, not maximal at onset, there are no neurologic findings on exam, the patient does not have a fever, the patient does not have any jaw claudication, the patient does not endorse a clotting disorder, patient denies any trauma or eye pain and the headache is not associated with dizziness, weakness on one side of the body, diplopia, vertigo, slurred speech, or ataxia. Given the extremely low risk of these diagnoses further testing and evaluation for these possibilities does not appear to be indicated at this time.   The patient improved significantly and was discharged in stable condition. Detailed discussions were had with the patient regarding current findings, and need for close f/u with PCP or on call doctor. The patient has been instructed to return immediately if the symptoms worsen in any way for re-evaluation. Patient verbalized understanding and is in agreement with current care plan. All questions answered prior to discharge.            Social determinants of health include -   Social History   Socioeconomic History   Marital status: Married    Spouse name: Not on file   Number of  children: Not on file   Years of education: Not on file   Highest education level: Not on file  Occupational History   Not on file  Tobacco Use   Smoking status: Never   Smokeless tobacco: Never  Vaping Use   Vaping Use: Never used  Substance and Sexual Activity   Alcohol use: No    Comment: Last used alcohol when it snowed, stating he did a shot.   Drug use: Yes    Types: Marijuana   Sexual activity: Not on file  Other Topics Concern   Not on file  Social History Narrative   Not on file   Social Determinants of Health   Financial Resource Strain: Not on file  Food Insecurity: Not on file  Transportation Needs: Not on file  Physical Activity: Not on file  Stress: Not on file  Social Connections: Not on file  Intimate Partner Violence: Not on file      This chart was dictated using voice recognition software.  Despite best efforts to proofread,  errors can occur which can change the documentation meaning.         Final Clinical Impression(s) / ED Diagnoses Final diagnoses:  Other migraine without status migrainosus, not intractable    Rx / DC Orders ED Discharge Orders          Ordered    pantoprazole (PROTONIX) 40 MG tablet  Daily        10/29/21 1139    sucralfate (CARAFATE) 1 g tablet  3 times daily with meals & bedtime        10/29/21 1139    metoCLOPramide (REGLAN) 5 MG tablet  Every 8 hours PRN        10/29/21 1140              Tanda Rockers A, DO 10/29/21 1216

## 2021-10-29 NOTE — ED Triage Notes (Signed)
Pt c/o migraine since Thursday along with vomiting blood that started Friday. Pt reports taking BC powders and Tylenol with minimal relief.

## 2021-10-29 NOTE — Discharge Instructions (Addendum)
It was a pleasure caring for you today in the emergency department.  Please return to the emergency department for any worsening or worrisome symptoms.  Please avoid use of NSAIDs such as Motrin or Aleve; avoid BC powder, Goody powder  Please follow up with your gastroenterologist/stomach doctor

## 2022-10-21 ENCOUNTER — Encounter (HOSPITAL_COMMUNITY): Payer: Self-pay

## 2022-10-21 ENCOUNTER — Emergency Department (HOSPITAL_COMMUNITY)
Admission: EM | Admit: 2022-10-21 | Discharge: 2022-10-21 | Disposition: A | Discharge status: Home / Self Care | Payer: Medicaid Other | Attending: Emergency Medicine | Admitting: Emergency Medicine

## 2022-10-21 ENCOUNTER — Other Ambulatory Visit: Payer: Self-pay

## 2022-10-21 DIAGNOSIS — K047 Periapical abscess without sinus: Secondary | ICD-10-CM | POA: Diagnosis not present

## 2022-10-21 DIAGNOSIS — R220 Localized swelling, mass and lump, head: Secondary | ICD-10-CM | POA: Diagnosis present

## 2022-10-21 MED ORDER — PENICILLIN V POTASSIUM 500 MG PO TABS
500.0000 mg | ORAL_TABLET | Freq: Three times a day (TID) | ORAL | 0 refills | Status: AC
Start: 2022-10-21 — End: ?

## 2022-10-21 NOTE — Discharge Instructions (Addendum)
Continue warm salt water rinses.  Use Orajel as directed.  Take the antibiotic until it is finished.  Please follow-up with dentistry, I have provided a resource list for you.

## 2022-10-21 NOTE — ED Provider Notes (Signed)
[place] EMERGENCY DEPARTMENT AT [facility] Provider Note   CSN: [id number] Arrival date & time: 10/21/22  1200     History  Chief Complaint  Patient presents with   Oral Swelling    James Boyle is a 32 y.o. male.  HPI     James Boyle is a 32 y.o. male who presents to the Emergency Department complaining of dental pain and swelling of his left lower jaw area.  He has having dental caries for some time with pain of his lower molars.  Swelling to his left jaw area few days ago.  Trying over-the-counter pain relievers without improvement.  States he is called multiple dentist offices, but appointments for several months now.  Denies fever, chills, neck pain, difficulty swallowing.  Home Medications Prior to Admission medications   Medication Sig Start Date End Date Taking? Authorizing Provider  metoCLOPramide (REGLAN) 5 MG tablet Take 1 tablet (5 mg total) by mouth every 8 (eight) hours as needed for nausea. 10/29/21   Sloan Leiter, DO  pantoprazole (PROTONIX) 40 MG tablet Take 1 tablet (40 mg total) by mouth daily. 05/16/21   Marquavion Venhuizen, PA-C  pantoprazole (PROTONIX) 40 MG tablet Take 1 tablet (40 mg total) by mouth daily for 14 days. 10/29/21 11/12/21  Sloan Leiter, DO  sucralfate (CARAFATE) 1 g tablet Take 1 tablet (1 g total) by mouth 4 (four) times daily -  with meals and at bedtime for 7 days. 10/29/21 11/05/21  Sloan Leiter, DO      Allergies    Motrin [ibuprofen] and Tramadol    Review of Systems   Review of Systems  Constitutional:  Negative for appetite change, chills and fever.  HENT:  Positive for dental problem. Negative for ear pain, sore throat and trouble swallowing.   Respiratory:  Negative for shortness of breath.   Cardiovascular:  Negative for chest pain.  Gastrointestinal:  Negative for nausea and vomiting.  Musculoskeletal:  Negative for neck pain and neck stiffness.  Neurological:  Negative for dizziness and headaches.    Physical  Exam Updated Vital Signs BP 128/86   Pulse 78   Temp 99 F (37.2 C) (Oral)   Resp 16   Ht 5\' 2"  (1.575 m)   Wt 74.8 kg   SpO2 97%   BMI 30.18 kg/m  Physical Exam Vitals and nursing note reviewed.  Constitutional:      General: He is not in acute distress.    Appearance: Normal appearance. He is not ill-appearing or toxic-appearing.  HENT:     Mouth/Throat:     Mouth: Mucous membranes are moist.     Pharynx: Oropharynx is clear. No posterior oropharyngeal erythema.     Comments: Widespread dental caries.  Tenderness palpation #17 and #19.  Erythema of the surrounding gingiva.  Mild swelling of the left lower jaw area.  No palpable abscess.  No trismus.  Uvula midline nonedematous. Cardiovascular:     Rate and Rhythm: Normal rate and regular rhythm.     Pulses: Normal pulses.  Pulmonary:     Effort: Pulmonary effort is normal.  Musculoskeletal:        General: Normal range of motion.  Skin:    General: Skin is warm.     Capillary Refill: Capillary refill takes less than 2 seconds.  Neurological:     General: No focal deficit present.     Mental Status: He is alert.     Sensory: No sensory deficit.  Motor: No weakness.     ED Results / Procedures / Treatments   Labs (all labs ordered are listed, but only abnormal results are displayed) Labs Reviewed - No data to display  EKG None  Radiology No results found.  Procedures Procedures    Medications Ordered in ED Medications - No data to display  ED Course/ Medical Decision Making/ A&P                             Medical Decision Making Patient here with dental pain for several days.  Noticed swelling of his left lower jaw area few days ago.  Has been unable to see a dentist.  No fever, neck pain or difficulty swallowing.  No trismus on exam.  He does have widespread dental decay and likely periodontal disease.   Handles secretions well.  No trismus on exam.  Suspect this is developing dental abscess, no  concerning symptoms for Ludewig's at this time.    Amount and/or Complexity of Data Reviewed Discussion of management or test interpretation with external provider(s): Prescription for antibiotics, short course of pain medication, recommended Orajel warm salt water rinses.  Resource list provided for local dentistry.  Appears appropriate for discharge home           Final Clinical Impression(s) / ED Diagnoses Final diagnoses:  Dental abscess    Rx / DC Orders ED Discharge Orders     None         Pauline Aus, PA-C 10/21/22 1247    Terrilee Files, MD 10/21/22 [phone]

## 2022-10-21 NOTE — ED Triage Notes (Signed)
Pt reports left lower jaw swelling and tooth pain.  Pt reports he is trying to get a dentist appointment but everyone is booked for a long time.

## 2022-10-31 ENCOUNTER — Other Ambulatory Visit: Payer: Self-pay

## 2022-10-31 ENCOUNTER — Emergency Department (HOSPITAL_COMMUNITY)
Admission: EM | Admit: 2022-10-31 | Discharge: 2022-10-31 | Disposition: A | Discharge status: Home / Self Care | Payer: Medicaid Other | Attending: Emergency Medicine | Admitting: Emergency Medicine

## 2022-10-31 ENCOUNTER — Encounter (HOSPITAL_COMMUNITY): Payer: Self-pay | Admitting: Emergency Medicine

## 2022-10-31 DIAGNOSIS — R1110 Vomiting, unspecified: Secondary | ICD-10-CM | POA: Insufficient documentation

## 2022-10-31 DIAGNOSIS — R197 Diarrhea, unspecified: Secondary | ICD-10-CM | POA: Insufficient documentation

## 2022-10-31 DIAGNOSIS — K529 Noninfective gastroenteritis and colitis, unspecified: Secondary | ICD-10-CM

## 2022-10-31 MED ORDER — SODIUM CHLORIDE 0.9 % IV BOLUS
1000.0000 mL | Freq: Once | INTRAVENOUS | Status: AC
  Administered 2022-10-31: 1000 mL via INTRAVENOUS

## 2022-10-31 MED ORDER — ACETAMINOPHEN 325 MG PO TABS
650.0000 mg | ORAL_TABLET | Freq: Once | ORAL | Status: AC
  Administered 2022-10-31: 650 mg via ORAL
  Filled 2022-10-31: qty 2

## 2022-10-31 MED ORDER — FAMOTIDINE 40 MG PO TABS: 40.0000 mg | ORAL_TABLET | Freq: Every day | ORAL | 0 refills | Status: AC

## 2022-10-31 MED ORDER — ONDANSETRON HCL 4 MG PO TABS: 4.0000 mg | ORAL_TABLET | Freq: Four times a day (QID) | ORAL | 0 refills | Status: AC

## 2022-10-31 MED ORDER — ONDANSETRON HCL 4 MG/2ML IJ SOLN
4.0000 mg | Freq: Once | INTRAMUSCULAR | Status: AC
Start: 2022-10-31 — End: 2022-10-31
  Administered 2022-10-31: 4 mg via INTRAVENOUS
  Filled 2022-10-31: qty 2

## 2022-10-31 MED ORDER — PANTOPRAZOLE SODIUM 40 MG PO TBEC
40.0000 mg | DELAYED_RELEASE_TABLET | Freq: Once | ORAL | Status: AC
  Administered 2022-10-31: 40 mg via ORAL
  Filled 2022-10-31: qty 1

## 2022-10-31 MED ORDER — LOPERAMIDE HCL 2 MG PO CAPS: 2.0000 mg | ORAL_CAPSULE | Freq: Four times a day (QID) | ORAL | 0 refills | Status: AC | PRN

## 2022-10-31 MED ORDER — LOPERAMIDE HCL 2 MG PO CAPS
2.0000 mg | ORAL_CAPSULE | Freq: Once | ORAL | Status: AC
  Administered 2022-10-31: 2 mg via ORAL
  Filled 2022-10-31: qty 1

## 2022-10-31 NOTE — ED Provider Notes (Signed)
[place] EMERGENCY DEPARTMENT AT [facility] Provider Note   CSN: [id number] Arrival date & time: 10/31/22  0725     History  Chief Complaint  Patient presents with   Emesis   Diarrhea    James Boyle is a 32 y.o. male.  This is a 32 year old male here today for vomiting and diarrhea that began last night.  Patient says that his children at home have similar symptoms.  Not having any abdominal pain.   Emesis Associated symptoms: diarrhea   Diarrhea Associated symptoms: vomiting        Home Medications Prior to Admission medications   Medication Sig Start Date End Date Taking? Authorizing Provider  metoCLOPramide (REGLAN) 5 MG tablet Take 1 tablet (5 mg total) by mouth every 8 (eight) hours as needed for nausea. 10/29/21   Sloan Leiter, DO  pantoprazole (PROTONIX) 40 MG tablet Take 1 tablet (40 mg total) by mouth daily. 05/16/21   Triplett, Tammy, PA-C  pantoprazole (PROTONIX) 40 MG tablet Take 1 tablet (40 mg total) by mouth daily for 14 days. 10/29/21 11/12/21  Tanda Rockers A, DO  penicillin v potassium (VEETID) 500 MG tablet Take 1 tablet (500 mg total) by mouth 3 (three) times daily. 10/21/22   Triplett, Tammy, PA-C  sucralfate (CARAFATE) 1 g tablet Take 1 tablet (1 g total) by mouth 4 (four) times daily -  with meals and at bedtime for 7 days. 10/29/21 11/05/21  Sloan Leiter, DO      Allergies    Patient has no active allergies.    Review of Systems   Review of Systems  Gastrointestinal:  Positive for diarrhea and vomiting.    Physical Exam Updated Vital Signs BP 119/80   Pulse (!) 107   Temp 98.4 F (36.9 C) (Oral)   Ht 5\' 2"  (1.575 m)   Wt 74.8 kg   SpO2 97%   BMI 30.18 kg/m  Physical Exam Vitals reviewed.  Constitutional:      Appearance: Normal appearance.  Abdominal:     Palpations: Abdomen is soft.     Tenderness: There is no abdominal tenderness.  Neurological:     Mental Status: He is alert.     ED Results / Procedures /  Treatments   Labs (all labs ordered are listed, but only abnormal results are displayed) Labs Reviewed - No data to display  EKG None  Radiology No results found.  Procedures Procedures    Medications Ordered in ED Medications  ondansetron (ZOFRAN) injection 4 mg (has no administration in time range)  sodium chloride 0.9 % bolus 1,000 mL (has no administration in time range)  loperamide (IMODIUM) capsule 2 mg (has no administration in time range)    ED Course/ Medical Decision Making/ A&P                                 Medical Decision Making 32 year old male here today for vomiting diarrhea.  Differential diagnoses include viral gastroenteritis, less likely acute intra-abdominal infection, less likely obstruction.  Plan- on exam, patient looks very well.  Resting comfortably in bed.  Normal vital signs.  With course of symptoms, exam, believe this is likely viral gastroenteritis.  No indication for labs, no indication for imaging.  Will symptomatically treat.  Will plan to discharge.  Risk Prescription drug management.           Final Clinical Impression(s) / ED Diagnoses  Final diagnoses:  None    Rx / DC Orders ED Discharge Orders     None         Arletha Pili, DO 10/31/22 [phone]

## 2022-10-31 NOTE — Discharge Instructions (Signed)
You can take 4 mg of Zofran every 8 hours for nausea.  You can take 2 mg of Imodium 2 times per day for diarrhea.  He can take 40 mg of famotidine each day for heartburn.  Your symptoms will probably improve over the next 2 to 2 days.  Continue to drink plenty of water.  Follow-up with your primary care doctor.

## 2022-10-31 NOTE — ED Triage Notes (Signed)
Pt reports he woke up feeling bad yesterday, but decided to go to work. 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 Pt reports on and off V/D for over 30 times. Pt reports he now has heartburn. Pt reports both of his children have vomited as well.

## 2023-05-22 ENCOUNTER — Emergency Department (HOSPITAL_COMMUNITY)
Admission: EM | Admit: 2023-05-22 | Discharge: 2023-05-22 | Discharge status: Against Medical Advice | Payer: Medicaid Other

## 2023-05-22 NOTE — ED Notes (Addendum)
 Attempted twice to call patient, no answer. Registration checked outside and did not see patient.

## 2023-05-26 ENCOUNTER — Encounter (HOSPITAL_COMMUNITY): Payer: Self-pay | Admitting: *Deleted

## 2023-05-26 ENCOUNTER — Emergency Department (HOSPITAL_COMMUNITY)
Admission: EM | Admit: 2023-05-26 | Discharge: 2023-05-27 | Disposition: A | Discharge status: Home / Self Care | Attending: Emergency Medicine | Admitting: Emergency Medicine

## 2023-05-26 ENCOUNTER — Other Ambulatory Visit: Payer: Self-pay

## 2023-05-26 DIAGNOSIS — R112 Nausea with vomiting, unspecified: Secondary | ICD-10-CM | POA: Diagnosis present

## 2023-05-26 DIAGNOSIS — R197 Diarrhea, unspecified: Secondary | ICD-10-CM | POA: Insufficient documentation

## 2023-05-26 LAB — COMPREHENSIVE METABOLIC PANEL
ALT: 682 U/L — ABNORMAL HIGH (ref 0–44)
AST: 438 U/L — ABNORMAL HIGH (ref 15–41)
Albumin: 4 g/dL (ref 3.5–5.0)
Alkaline Phosphatase: 57 U/L (ref 38–126)
Anion gap: 9 (ref 5–15)
BUN: 14 mg/dL (ref 6–20)
CO2: 26 mmol/L (ref 22–32)
Calcium: 9.3 mg/dL (ref 8.9–10.3)
Chloride: 102 mmol/L (ref 98–111)
Creatinine, Ser: 0.83 mg/dL (ref 0.61–1.24)
GFR, Estimated: >60 mL/min (ref >60)
Glucose, Bld: 136 mg/dL — ABNORMAL HIGH (ref 70–99)
Potassium: 3.9 mmol/L (ref 3.5–5.1)
Sodium: 137 mmol/L (ref 135–145)
Total Bilirubin: 0.4 mg/dL (ref 0.0–1.2)
Total Protein: 7.7 g/dL (ref 6.5–8.1)

## 2023-05-26 LAB — CBC
HCT: 46.2 % (ref 39.0–52.0)
Hemoglobin: 14.8 g/dL (ref 13.0–17.0)
MCH: 30.3 pg (ref 26.0–34.0)
MCHC: 32 g/dL (ref 30.0–36.0)
MCV: 94.5 fL (ref 80.0–100.0)
Platelets: 362 10*3/uL (ref 150–400)
RBC: 4.89 MIL/uL (ref 4.22–5.81)
RDW: 12.7 % (ref 11.5–15.5)
WBC: 6 10*3/uL (ref 4.0–10.5)
nRBC: 0 % (ref 0.0–0.2)

## 2023-05-26 LAB — LIPASE, BLOOD: Lipase: 26 U/L (ref 11–51)

## 2023-05-26 MED ORDER — ONDANSETRON 4 MG PO TBDP
4.0000 mg | ORAL_TABLET | Freq: Once | ORAL | Status: AC | PRN
  Administered 2023-05-26: 4 mg via ORAL
  Filled 2023-05-26: qty 1

## 2023-05-26 NOTE — ED Triage Notes (Signed)
 Vomiting and diarrhea since 2 am this morning.

## 2023-05-27 ENCOUNTER — Emergency Department (HOSPITAL_COMMUNITY)

## 2023-05-27 ENCOUNTER — Telehealth: Payer: Self-pay | Admitting: Internal Medicine

## 2023-05-27 LAB — HEPATITIS PANEL, ACUTE
HCV Ab: NONREACTIVE
Hep A IgM: NONREACTIVE
Hep B C IgM: NONREACTIVE
Hepatitis B Surface Ag: NONREACTIVE

## 2023-05-27 LAB — PROTIME-INR
INR: 1.1 (ref 0.8–1.2)
Prothrombin Time: 14.2 s (ref 11.4–15.2)

## 2023-05-27 MED ORDER — SODIUM CHLORIDE 0.9 % IV BOLUS
1000.0000 mL | Freq: Once | INTRAVENOUS | Status: AC
  Administered 2023-05-27: 1000 mL via INTRAVENOUS

## 2023-05-27 MED ORDER — IOHEXOL 300 MG/ML  SOLN
100.0000 mL | Freq: Once | INTRAMUSCULAR | Status: AC | PRN
  Administered 2023-05-27: 100 mL via INTRAVENOUS

## 2023-05-27 MED ORDER — ONDANSETRON HCL 4 MG/2ML IJ SOLN
4.0000 mg | Freq: Once | INTRAMUSCULAR | Status: AC
  Administered 2023-05-27: 4 mg via INTRAVENOUS
  Filled 2023-05-27: qty 2

## 2023-05-27 MED ORDER — ONDANSETRON 4 MG PO TBDP: ORAL_TABLET | ORAL | 0 refills | Status: AC

## 2023-05-27 MED ORDER — METOCLOPRAMIDE HCL 5 MG/ML IJ SOLN
10.0000 mg | Freq: Once | INTRAMUSCULAR | Status: AC
  Administered 2023-05-27: 10 mg via INTRAVENOUS
  Filled 2023-05-27: qty 2

## 2023-05-27 NOTE — Discharge Instructions (Addendum)
 Your blood work today shows that your liver is inflamed.  Do not take any Tylenol or drink any alcohol.  You will need to follow-up with a specialist, Dr. Marletta Lor.  He will likely contact you with follow-up but if you do not hear from him in the next 1 or 2 days, call the office.

## 2023-05-27 NOTE — ED Provider Notes (Signed)
 [place] EMERGENCY DEPARTMENT AT [facility] Provider Note   CSN: [id number] Arrival date & time: 05/26/23  2054     History  Chief Complaint  Patient presents with   Emesis    James Boyle is a 33 y.o. male.  Presents to the emergency department for evaluation of nausea, vomiting and diarrhea.  Patient reports that symptoms have been present throughout the day.  He also had similar symptoms several days ago that resolved.  Denies any significant pain.  No fever.       Home Medications Prior to Admission medications   Medication Sig Start Date End Date Taking? Authorizing Provider  ondansetron (ZOFRAN-ODT) 4 MG disintegrating tablet 4mg  ODT q4 hours prn nausea/vomit 05/27/23  Yes Zarion Oliff, Canary Brim, MD  famotidine (PEPCID) 40 MG tablet Take 1 tablet (40 mg total) by mouth daily. 10/31/22 11/30/22  Anders Simmonds T, DO  loperamide (IMODIUM) 2 MG capsule Take 1 capsule (2 mg total) by mouth 4 (four) times daily as needed for diarrhea or loose stools. 10/31/22   Arletha Pili, DO  metoCLOPramide (REGLAN) 5 MG tablet Take 1 tablet (5 mg total) by mouth every 8 (eight) hours as needed for nausea. 10/29/21   Sloan Leiter, DO  ondansetron (ZOFRAN) 4 MG tablet Take 1 tablet (4 mg total) by mouth every 6 (six) hours. 10/31/22   Arletha Pili, DO  pantoprazole (PROTONIX) 40 MG tablet Take 1 tablet (40 mg total) by mouth daily. 05/16/21   Triplett, Tammy, PA-C  pantoprazole (PROTONIX) 40 MG tablet Take 1 tablet (40 mg total) by mouth daily for 14 days. 10/29/21 11/12/21  Tanda Rockers A, DO  penicillin v potassium (VEETID) 500 MG tablet Take 1 tablet (500 mg total) by mouth 3 (three) times daily. 10/21/22   Triplett, Tammy, PA-C  sucralfate (CARAFATE) 1 g tablet Take 1 tablet (1 g total) by mouth 4 (four) times daily -  with meals and at bedtime for 7 days. 10/29/21 11/05/21  Sloan Leiter, DO      Allergies    Patient has no active allergies.    Review of Systems   Review of  Systems  Physical Exam Updated Vital Signs BP 101/72   Pulse 68   Temp 98.1 F (36.7 C) (Oral)   Resp 18   SpO2 93%  Physical Exam Vitals and nursing note reviewed.  Constitutional:      General: He is not in acute distress.    Appearance: He is well-developed.  HENT:     Head: Normocephalic and atraumatic.     Mouth/Throat:     Mouth: Mucous membranes are moist.  Eyes:     General: Vision grossly intact. Gaze aligned appropriately.     Extraocular Movements: Extraocular movements intact.     Conjunctiva/sclera: Conjunctivae normal.  Cardiovascular:     Rate and Rhythm: Normal rate and regular rhythm.     Pulses: Normal pulses.     Heart sounds: Normal heart sounds, S1 normal and S2 normal. No murmur heard.    No friction rub. No gallop.  Pulmonary:     Effort: Pulmonary effort is normal. No respiratory distress.     Breath sounds: Normal breath sounds.  Abdominal:     Palpations: Abdomen is soft.     Tenderness: There is no abdominal tenderness. There is no guarding or rebound.     Hernia: No hernia is present.  Musculoskeletal:        General: No swelling.  Cervical back: Full passive range of motion without pain, normal range of motion and neck supple. No pain with movement, spinous process tenderness or muscular tenderness. Normal range of motion.     Right lower leg: No edema.     Left lower leg: No edema.  Skin:    General: Skin is warm and dry.     Capillary Refill: Capillary refill takes less than 2 seconds.     Findings: No ecchymosis, erythema, lesion or wound.  Neurological:     Mental Status: He is alert and oriented to person, place, and time.     GCS: GCS eye subscore is 4. GCS verbal subscore is 5. GCS motor subscore is 6.     Cranial Nerves: Cranial nerves 2-12 are intact.     Sensory: Sensation is intact.     Motor: Motor function is intact. No weakness or abnormal muscle tone.     Coordination: Coordination is intact.  Psychiatric:        Mood  and Affect: Mood normal.        Speech: Speech normal.        Behavior: Behavior normal.     ED Results / Procedures / Treatments   Labs (all labs ordered are listed, but only abnormal results are displayed) Labs Reviewed  COMPREHENSIVE METABOLIC PANEL - Abnormal; Notable for the following components:      Result Value   Glucose, Bld 136 (*)    AST 438 (*)    ALT 682 (*)    All other components within normal limits  LIPASE, BLOOD  CBC  URINALYSIS, ROUTINE W REFLEX MICROSCOPIC  HEPATITIS PANEL, ACUTE  PROTIME-INR    EKG None  Radiology CT ABDOMEN PELVIS W CONTRAST Result Date: 05/27/2023 CLINICAL DATA:  Acute abdominal pain with vomiting and diarrhea for 1 day EXAM: CT ABDOMEN AND PELVIS WITH CONTRAST TECHNIQUE: Multidetector CT imaging of the abdomen and pelvis was performed using the standard protocol following bolus administration of intravenous contrast. RADIATION DOSE REDUCTION: This exam was performed according to the departmental dose-optimization program which includes automated exposure control, adjustment of the mA and/or kV according to patient size and/or use of iterative reconstruction technique. CONTRAST:  OMNIPAQUE IOHEXOL 300 MG/ML  SOLN COMPARISON:  07/11/2021 FINDINGS: Lower chest: No acute abnormality. Hepatobiliary: No focal liver abnormality is seen. No gallstones, gallbladder wall thickening, or biliary dilatation. Pancreas: Unremarkable. No pancreatic ductal dilatation or surrounding inflammatory changes. Spleen: Normal in size without focal abnormality. Adrenals/Urinary Tract: Adrenal glands are within normal limits. Kidneys demonstrate a normal enhancement pattern bilaterally. No calculi are noted. No obstructive changes are seen. The bladder is partially distended. Stomach/Bowel: No obstructive or inflammatory changes of the colon are noted. 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 Small bowel is unremarkable. Small sliding-type hiatal hernia is noted.  Vascular/Lymphatic: No significant vascular findings are present. No enlarged abdominal or pelvic lymph nodes. Reproductive: Prostate is unremarkable. Other: No abdominal wall hernia or abnormality. No abdominopelvic ascites. Musculoskeletal: No acute or significant osseous findings. IMPRESSION: Hiatal hernia. No other focal abnormality is noted. Electronically Signed   By: Alcide Clever M.D.   On: 05/27/2023 02:56    Procedures Procedures    Medications Ordered in ED Medications  ondansetron (ZOFRAN-ODT) disintegrating tablet 4 mg (4 mg Oral Given 05/26/23 2100)  sodium chloride 0.9 % bolus 1,000 mL (1,000 mLs Intravenous New Bag/Given 05/27/23 0127)  ondansetron (ZOFRAN) injection 4 mg (4 mg Intravenous Given 05/27/23 0128)  metoCLOPramide (REGLAN) injection 10 mg (  10 mg Intravenous Given 05/27/23 0128)  iohexol (OMNIPAQUE) 300 MG/ML solution 100 mL (100 mLs Intravenous Contrast Given 05/27/23 0136)    ED Course/ Medical Decision Making/ A&P                                 Medical Decision Making Amount and/or Complexity of Data Reviewed Labs: ordered. Radiology: ordered.  Risk Prescription drug management.   Patient presents to the emergency department for evaluation of nausea, vomiting, diarrhea.  Patient reports that he had onset of nausea and vomiting several days ago but it resolved.  Symptoms returned today, now with diarrhea.  Patient complaining of mild diffuse abdominal pain.  Patient appears well.  No significant distress.  He is afebrile, vital signs normal.  Treated with fluids and antiemetics.  He seems to be doing well.  Lab work reveals transaminitis.  No elevated bilirubin.  CT abdomen and pelvis normal.  Discussed with Dr. Marletta Lor, on-call for GI.  He feels that it is appropriate for outpatient follow-up as the patient does appear well.  Acute hepatitis panel pending.  He asked for an INR prior to discharge, if no abnormality, discharge and he will arrange for  follow-up.        Final Clinical Impression(s) / ED Diagnoses Final diagnoses:  Nausea vomiting and diarrhea    Rx / DC Orders ED Discharge Orders          Ordered    ondansetron (ZOFRAN-ODT) 4 MG disintegrating tablet        05/27/23 0424              Tara Rud, Canary Brim, MD 05/27/23 [phone]

## 2023-05-27 NOTE — Telephone Encounter (Signed)
 Please arrange OV for this patient, ER fu visit elevated LFTs. Previously unassigned so can put with whoever has availability. Thank you

## 2023-09-21 ENCOUNTER — Encounter (HOSPITAL_COMMUNITY): Payer: Self-pay | Admitting: Emergency Medicine

## 2023-09-21 ENCOUNTER — Emergency Department (HOSPITAL_COMMUNITY)
Admission: EM | Admit: 2023-09-21 | Discharge: 2023-09-21 | Disposition: A | Discharge status: Home / Self Care | Attending: Emergency Medicine | Admitting: Emergency Medicine

## 2023-09-21 ENCOUNTER — Other Ambulatory Visit: Payer: Self-pay

## 2023-09-21 DIAGNOSIS — W458XXA Other foreign body or object entering through skin, initial encounter: Secondary | ICD-10-CM | POA: Insufficient documentation

## 2023-09-21 DIAGNOSIS — T1592XA Foreign body on external eye, part unspecified, left eye, initial encounter: Secondary | ICD-10-CM

## 2023-09-21 DIAGNOSIS — T1502XA Foreign body in cornea, left eye, initial encounter: Secondary | ICD-10-CM | POA: Diagnosis not present

## 2023-09-21 DIAGNOSIS — H5712 Ocular pain, left eye: Secondary | ICD-10-CM | POA: Diagnosis present

## 2023-09-21 MED ORDER — TETRACAINE HCL 0.5 % OP SOLN
2.0000 [drp] | Freq: Once | OPHTHALMIC | Status: AC
  Administered 2023-09-21: 2 [drp] via OPHTHALMIC
  Filled 2023-09-21: qty 4

## 2023-09-21 MED ORDER — NEOMYCIN-POLYMYXIN-DEXAMETH 3.5-10000-0.1 OP SUSP
2.0000 [drp] | Freq: Four times a day (QID) | OPHTHALMIC | 0 refills | Status: AC
Start: 2023-09-21 — End: 2023-09-26

## 2023-09-21 MED ORDER — FLUORESCEIN SODIUM 1 MG OP STRP
1.0000 | ORAL_STRIP | Freq: Once | OPHTHALMIC | Status: AC
  Filled 2023-09-21: qty 1

## 2023-09-21 MED ORDER — NEOMYCIN-POLYMYXIN-DEXAMETH 3.5-10000-0.1 OP SUSP
2.0000 [drp] | Freq: Once | OPHTHALMIC | Status: AC
  Administered 2023-09-21: 2 [drp] via OPHTHALMIC
  Filled 2023-09-21 (×2): qty 5

## 2023-09-21 MED ORDER — FLUORESCEIN SODIUM 1 MG OP STRP
ORAL_STRIP | OPHTHALMIC | Status: AC
  Administered 2023-09-21: 1 via OPHTHALMIC
  Filled 2023-09-21: qty 1

## 2023-09-21 NOTE — ED Notes (Signed)
 Pt/family received d/c paperwork at this time. After going over the paperwork any questions, comments, or concerns were answered to the best of this nurse's knowledge. The pt/family verbally acknowledged the teachings/instructions.

## 2023-09-21 NOTE — ED Provider Notes (Signed)
 [place] [facility] AT [facility] Provider Note   CSN: [id number] Arrival date & time: 09/21/23  1829     Patient presents with: Foreign Body in Eye   James Boyle is a 33 y.o. male presents today for left eye pain.  Patient was using a grinder on his car yesterday and thinks a metal shaving got into his eye.  Patient reports light sensitivity and watering which causes blurred vision at times.  Patient denies contact use, erythema around eye, fever, or chills.    Foreign Body in Eye       Prior to Admission medications   Medication Sig Start Date End Date Taking? Authorizing Provider  neomycin-polymyxin b-dexamethasone  (MAXITROL) 3.5-10000-0.1 SUSP Place 2 drops into the left eye every 6 (six) hours for 5 days. 09/21/23 09/26/23 Yes Markeesha Char N, PA-C  famotidine  (PEPCID ) 40 MG tablet Take 1 tablet (40 mg total) by mouth daily. 10/31/22 11/30/22  Mannie Pac T, DO  loperamide  (IMODIUM ) 2 MG capsule Take 1 capsule (2 mg total) by mouth 4 (four) times daily as needed for diarrhea or loose stools. 10/31/22   Mannie Pac DASEN, DO  metoCLOPramide  (REGLAN ) 5 MG tablet Take 1 tablet (5 mg total) by mouth every 8 (eight) hours as needed for nausea. 10/29/21   Elnor Jayson LABOR, DO  ondansetron  (ZOFRAN ) 4 MG tablet Take 1 tablet (4 mg total) by mouth every 6 (six) hours. 10/31/22   Mannie Pac T, DO  ondansetron  (ZOFRAN -ODT) 4 MG disintegrating tablet 4mg  ODT q4 hours prn nausea/vomit 05/27/23   Pollina, Lonni PARAS, MD  pantoprazole  (PROTONIX ) 40 MG tablet Take 1 tablet (40 mg total) by mouth daily. 05/16/21   Triplett, Tammy, PA-C  pantoprazole  (PROTONIX ) 40 MG tablet Take 1 tablet (40 mg total) by mouth daily for 14 days. 10/29/21 11/12/21  Elnor Jayson LABOR, DO  penicillin  v potassium (VEETID) 500 MG tablet Take 1 tablet (500 mg total) by mouth 3 (three) times daily. 10/21/22   Triplett, Tammy, PA-C  sucralfate  (CARAFATE ) 1 g tablet Take 1 tablet (1 g total) by mouth 4 (four)  times daily -  with meals and at bedtime for 7 days. 10/29/21 11/05/21  Elnor Jayson LABOR, DO    Allergies: Patient has no known allergies.    Review of Systems  Eyes:  Positive for photophobia, pain, redness and visual disturbance.    Updated Vital Signs BP 109/71   Pulse 63   Temp 98.7 F (37.1 C)   Resp 18   Ht 5' 2 (1.575 m)   Wt 74.8 kg   SpO2 96%   BMI 30.18 kg/m   Physical Exam Vitals and nursing note reviewed.  Constitutional:      General: He is not in acute distress.    Appearance: He is well-developed. He is not toxic-appearing.  HENT:     Head: Normocephalic and atraumatic.   Eyes:     General: Lids are normal. Vision grossly intact.        Left eye: Foreign body present.    Conjunctiva/sclera:     Left eye: Left conjunctiva is injected.     Pupils: Pupils are equal, round, and reactive to light.      Comments: Patient's left eye stained with fluorescein  and noticed to have uptake in the area noted above, possibly retained metal fragments. Using cotton swab a small fleck a metal was able to be removed from the patient's eye, some staining still noted to area where metal  fleck previously was.   Cardiovascular:     Rate and Rhythm: Normal rate and regular rhythm.     Heart sounds: No murmur heard. Pulmonary:     Effort: Pulmonary effort is normal. No respiratory distress.     Breath sounds: Normal breath sounds.  Abdominal:     Palpations: Abdomen is soft.     Tenderness: There is no abdominal tenderness.   Musculoskeletal:        General: No swelling.     Cervical back: Neck supple.   Skin:    General: Skin is warm and dry.     Capillary Refill: Capillary refill takes less than 2 seconds.   Neurological:     Mental Status: He is alert.   Psychiatric:        Mood and Affect: Mood normal.     (all labs ordered are listed, but only abnormal results are displayed) Labs Reviewed - No data to display  EKG: None  Radiology: No results  found.   .Foreign Body Removal  Date/Time: 09/21/2023 8:39 PM  Performed by: Francis Ileana SAILOR, PA-C Authorized by: Francis Ileana SAILOR, PA-C  Consent: Verbal consent obtained Consent given by: patient Patient identity confirmed: verbally with patient Body area: eye Location details: left cornea  Anesthesia: Local Anesthetic: tetracaine  drops  Sedation: Patient sedated: no  Patient restrained: no Patient cooperative: yes Localization method: visualized Removal mechanism: moist cotton swab Eye examined with fluorescein . Fluorescein  uptake. Corneal abrasion size: small Corneal abrasion location: central Residual rust ring present. Dressing: antibiotic drops Depth: embedded Complexity: simple 1 objects recovered. Objects recovered: Metal shaving Post-procedure assessment: foreign body removed Patient tolerance: patient tolerated the procedure well with no immediate complications     Medications Ordered in the ED  neomycin-polymyxin b-dexamethasone  (MAXITROL) ophthalmic suspension 2 drop (has no administration in time range)  tetracaine  (PONTOCAINE) 0.5 % ophthalmic solution 2 drop (2 drops Left Eye Given 09/21/23 1856)  fluorescein  ophthalmic strip 1 strip (1 strip Left Eye Given 09/21/23 1857)                                    Medical Decision Making Risk Prescription drug management.   This patient presents to the ED for concern of left eye pain differential diagnosis includes corneal abrasion, corneal ulcer, foreign body, preseptal cellulitis, orbital cellulitis  Additional history obtained   Additional history obtained from Electronic Medical Record External records from outside source obtained and reviewed including Care Everywhere   Medicines ordered and prescription drug management:  I ordered medication including Maxitrol eyedrops    I have reviewed the patients home medicines and have made adjustments as needed   Problem List / ED Course:  Consulted  ophthalmology, Dr. Maree who recommended starting the patient on Maxitrol eyedrops and follow-up in clinic tomorrow at 9 AM Considered for admission or further workup however patient's vital signs and physical exam are reassuring.  Foreign body was able to be removed from patient's left eye.  Patient prescribed Maxitrol eyedrops as recommended by ophthalmology and to follow-up in office tomorrow at 9 AM.  Patient given return precautions.  I feel patient safe for discharge at this time.      Final diagnoses:  Foreign body of left eye, initial encounter    ED Discharge Orders          Ordered    neomycin-polymyxin b-dexamethasone  (MAXITROL) 3.5-10000-0.1 SUSP  Every 6 hours  09/21/23 2038               Francis Ileana SAILOR, PA-C 09/21/23 2040    Towana Ozell BROCKS, MD 09/22/23 [phone]

## 2023-09-21 NOTE — ED Notes (Signed)
 Visual Acu.  Right eye 20/15  Left 20/20  Both 20/15

## 2023-09-21 NOTE — Discharge Instructions (Addendum)
 Today you were seen for a foreign body in your left eye.  Please pick up your eyedrops and use 4 times daily.  Please follow-up with Dr. Maree with ophthalmology tomorrow at 9 AM at the address above.  Thank you for letting us  treat you today. After performing a physical exam, I feel you are safe to go home. Please follow up with your PCP in the next several days and provide them with your records from this visit. Return to the Emergency Room if pain becomes severe or symptoms worsen.

## 2023-09-21 NOTE — ED Triage Notes (Signed)
 Pt c/o left eye pain after he got what he thinks was a metal shaving in his eye while using a grinder yesterday. Pt has pain and light sensitivity with occasional blurriness. No obvious object visible but pt's sclera is red and irritated.

## 2023-10-09 IMAGING — CT CT ABD-PELV W/ CM
2 of 5 series · 15 of 46 positions shown, 17 images · IV contrast (Omnipaque or Isovue)
Comparison: CT abdomen pelvis 06/26/2021

CLINICAL DATA: Abdominal pain, acute, nonlocalized Reported blood
in BM, refused occult testing, Appendectomy this month. N/V.

EXAM:
CT ABDOMEN AND PELVIS WITH CONTRAST
TECHNIQUE: Multidetector CT imaging of the abdomen and pelvis was performed
using the standard protocol following bolus administration of
intravenous contrast.

[Series 2: axial st · axial · 0.79mm/px · z∈[+876,+1276]mm · 12 of 96 slices shown, 14 images]
[im 8/96  soft-tissue]
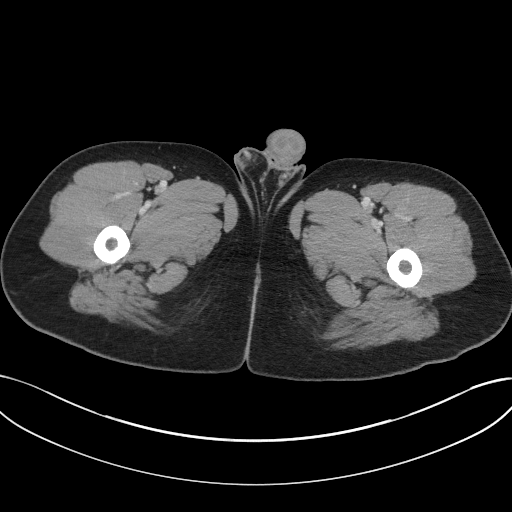
[im 8/96  bone]
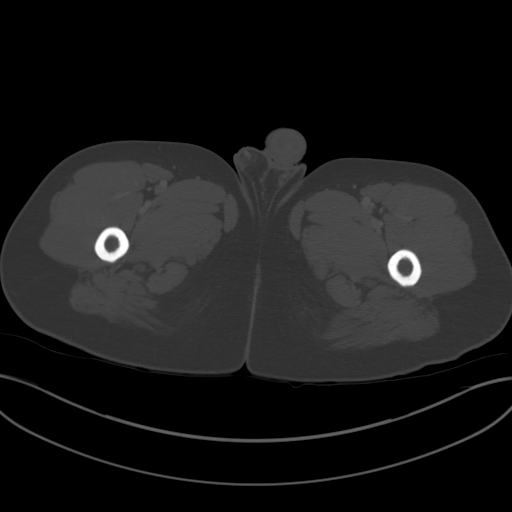
[im 15/96  soft-tissue]
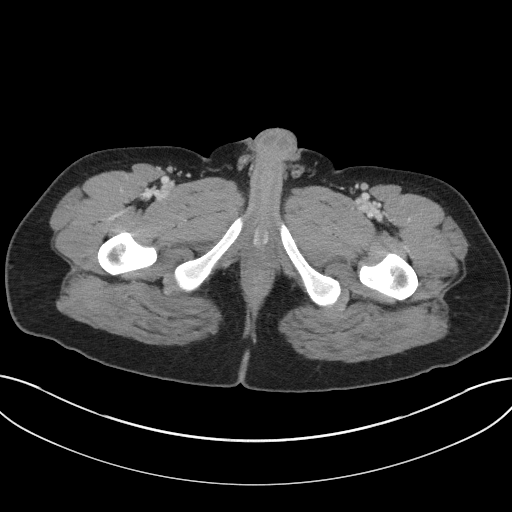
[im 22/96  soft-tissue]
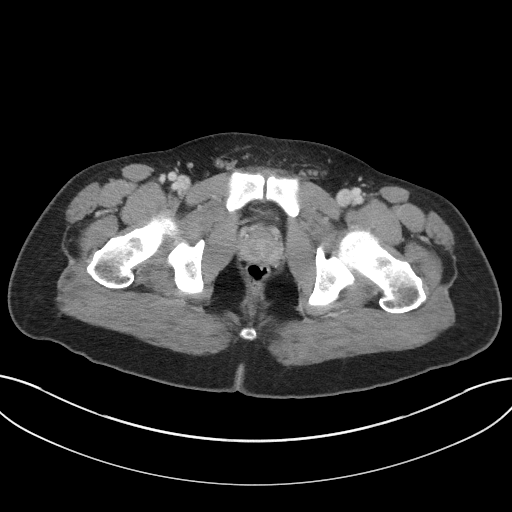
[im 30/96  soft-tissue]
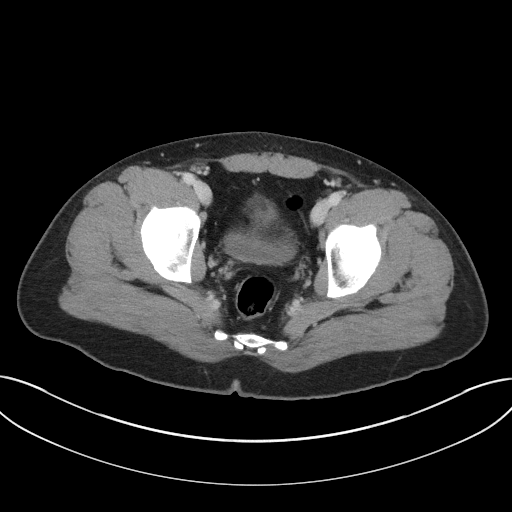
[im 37/96  soft-tissue]
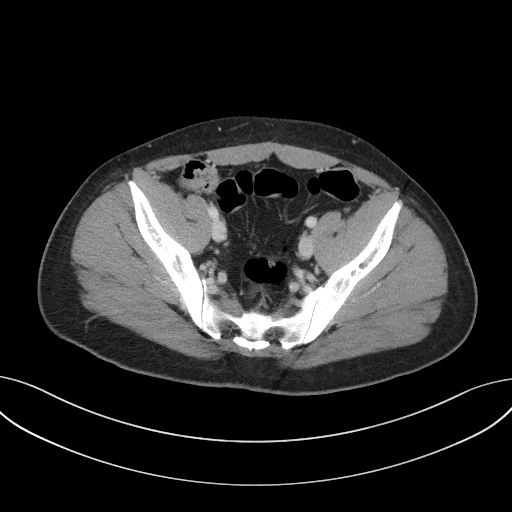
[im 44/96  soft-tissue]
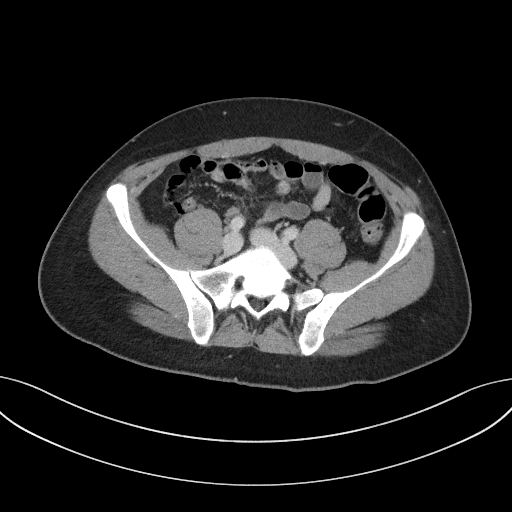
[im 52/96  soft-tissue]
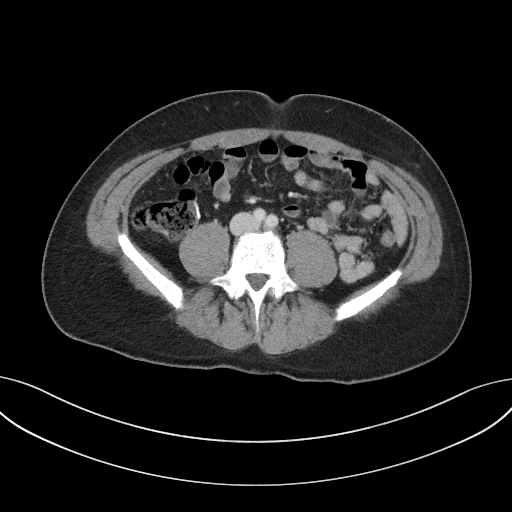
[im 59/96  soft-tissue]
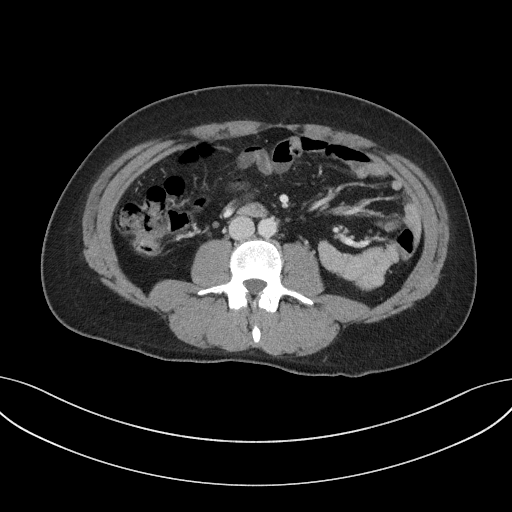
[im 66/96  soft-tissue]
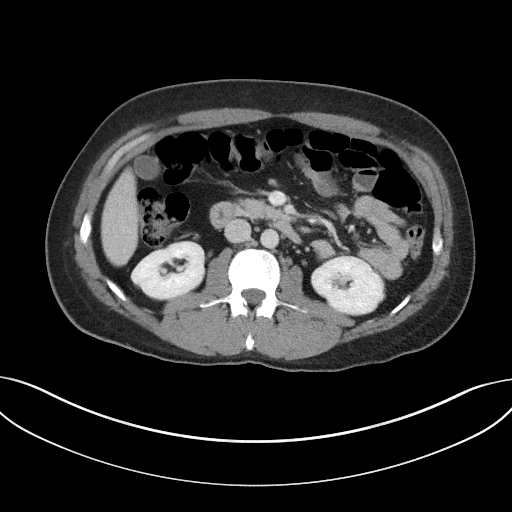
[im 66/96  bone]
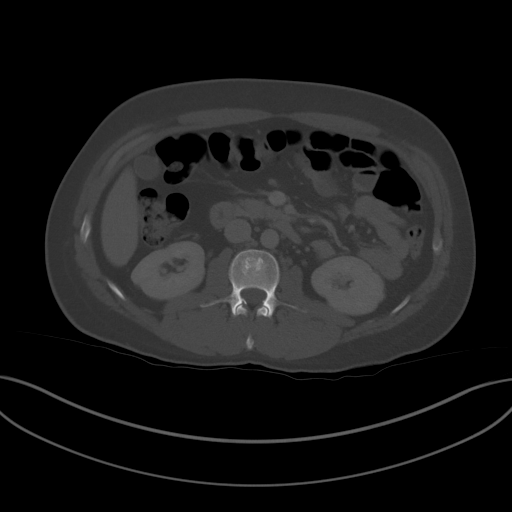
[im 74/96  soft-tissue]
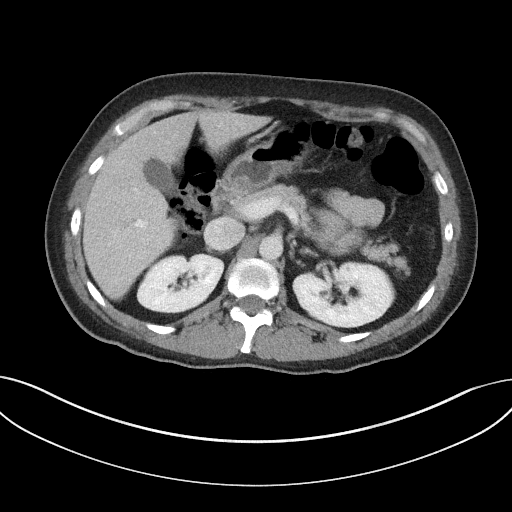
[im 81/96  soft-tissue]
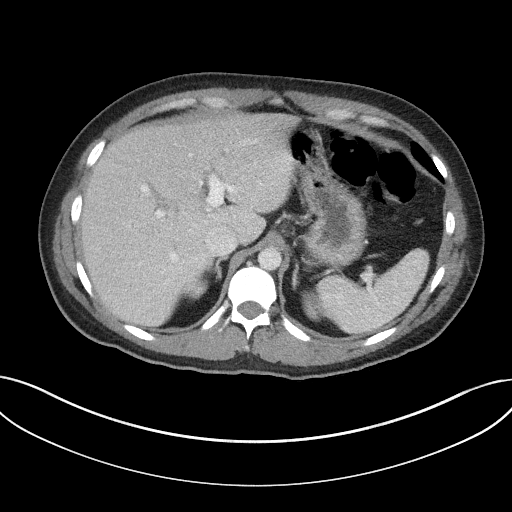
[im 88/96  soft-tissue]
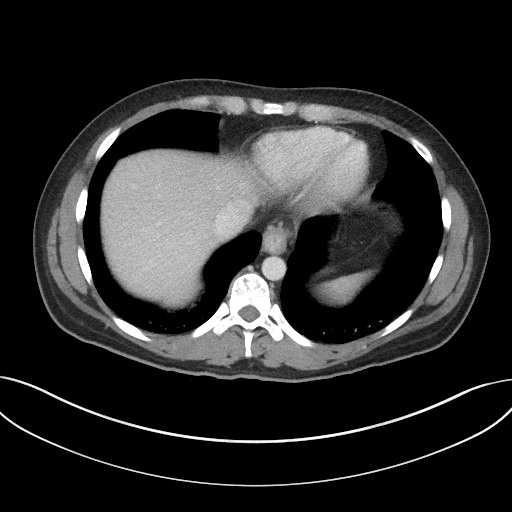

[Series 5: coronal st · coronal · 0.83mm/px · 3 of 96 slices shown]
[im 32/96  soft-tissue]
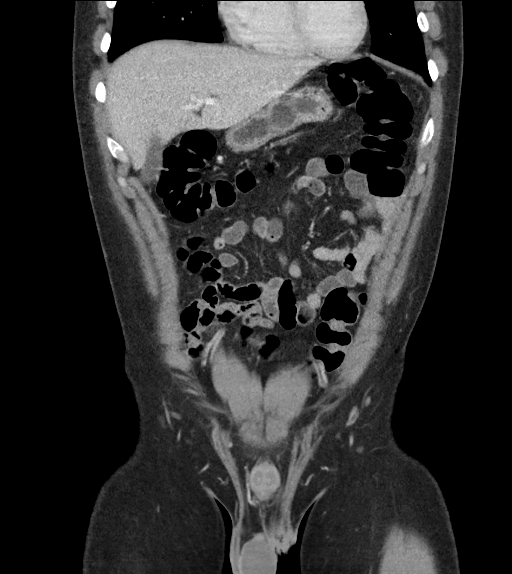
[im 43/96  soft-tissue]
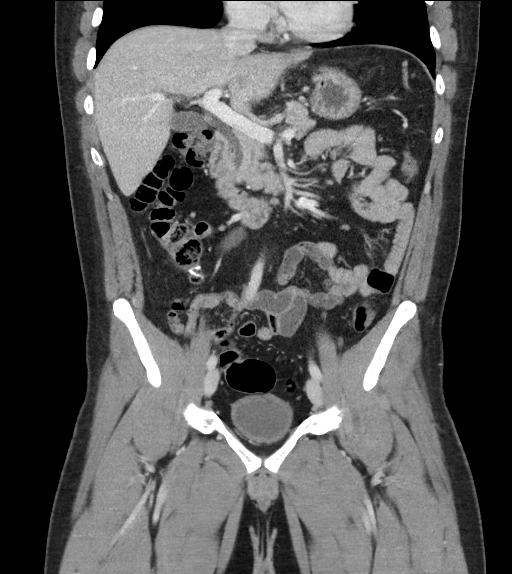
[im 53/96  soft-tissue]
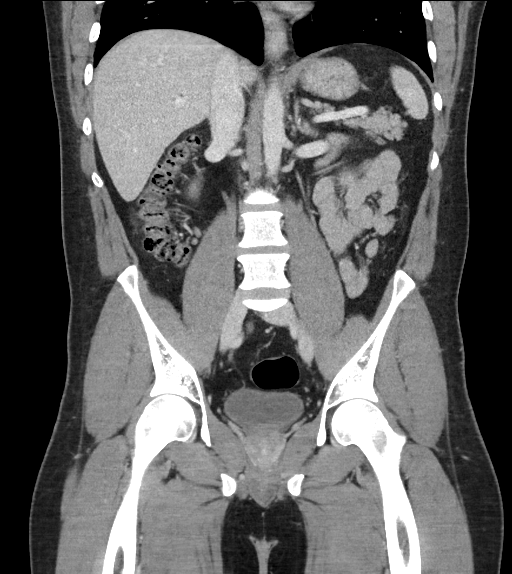

[15 of 46 positions shown; findings below may reference images not displayed]

RADIATION DOSE REDUCTION: This exam was performed according to the
departmental dose-optimization program which includes automated
exposure control, adjustment of the mA and/or kV according to
patient size and/or use of iterative reconstruction technique.

CONTRAST:  100mL OMNIPAQUE IOHEXOL 300 MG/ML  SOLN
FINDINGS: Lower chest: Bibasilar hypoventilatory changes. No acute
abnormality.

Hepatobiliary: No focal liver abnormality is seen. The gallbladder
is unremarkable.

Pancreas: Unremarkable. No pancreatic ductal dilatation or
surrounding inflammatory changes.

Spleen: Normal in size without focal abnormality.

Adrenals/Urinary Tract: Adrenal glands are unremarkable. No
hydronephrosis. Unchanged punctate nonobstructive renal stones
bilaterally. Bladder is unremarkable.

Stomach/Bowel: The stomach is within normal limits. There is no
evidence of bowel obstruction.Postsurgical changes of recent
appendectomy.

Vascular/Lymphatic: No significant vascular findings are present. No
enlarged abdominal or pelvic lymph nodes.

Reproductive: Unremarkable.

Other: No abdominal wall hernia or abnormality. No abdominopelvic
ascites. No focal fluid collection.

Musculoskeletal: No acute osseous abnormality. No suspicious lytic
or blastic lesions. Limbus vertebrae of the anterior superior aspect
of L5. There are multiple Schmorl's nodes noted.
IMPRESSION: No acute abdominopelvic abnormality. Postsurgical changes of recent
appendectomy.

Unchanged bilateral punctate nonobstructive renal stones.
